# Patient Record
Sex: Male | Born: 1935 | Race: Black or African American | Hispanic: No | Smoking: Current every day smoker
Health system: Southern US, Community
[De-identification: ages and names within clinical notes are randomized; demographics above are authoritative.]

## PROBLEM LIST (undated history)

## (undated) DIAGNOSIS — I4891 Unspecified atrial fibrillation: Secondary | ICD-10-CM

## (undated) DIAGNOSIS — F329 Major depressive disorder, single episode, unspecified: Secondary | ICD-10-CM

## (undated) DIAGNOSIS — I2699 Other pulmonary embolism without acute cor pulmonale: Secondary | ICD-10-CM

## (undated) DIAGNOSIS — E119 Type 2 diabetes mellitus without complications: Secondary | ICD-10-CM

## (undated) DIAGNOSIS — F32A Depression, unspecified: Secondary | ICD-10-CM

## (undated) DIAGNOSIS — D72829 Elevated white blood cell count, unspecified: Secondary | ICD-10-CM

## (undated) DIAGNOSIS — D696 Thrombocytopenia, unspecified: Secondary | ICD-10-CM

## (undated) DIAGNOSIS — I10 Essential (primary) hypertension: Secondary | ICD-10-CM

## (undated) DIAGNOSIS — M6281 Muscle weakness (generalized): Secondary | ICD-10-CM

---

## 2006-09-01 ENCOUNTER — Ambulatory Visit: Payer: Self-pay | Admitting: Cardiology

## 2009-03-11 ENCOUNTER — Ambulatory Visit: Payer: Self-pay | Admitting: Cardiology

## 2009-04-11 ENCOUNTER — Encounter: Payer: Self-pay | Admitting: Cardiology

## 2010-05-03 NOTE — Letter (Signed)
Summary: Appointment -missed  [facility] [address]   [place], [place] [address]   Phone: [phone]  Fax: [phone]     April 11, 2009 MRN: [id number]      Brendan Walters [address] [place], [place]  [address]      Dear Mr. [name],  Our records indicate you missed your appointment on April 11, 2009                        with Dr.  Antoine Poche.   It is very important that we reach you to reschedule this appointment. We look forward to participating in your health care needs.   Please contact us at the number listed above at your earliest convenience to reschedule this appointment.   Sincerely,    Glass blower/designer

## 2012-09-04 ENCOUNTER — Emergency Department (HOSPITAL_COMMUNITY): Payer: Medicare Other

## 2012-09-04 ENCOUNTER — Emergency Department (HOSPITAL_COMMUNITY)
Admission: EM | Admit: 2012-09-04 | Discharge: 2012-09-04 | Disposition: A | Discharge status: Home / Self Care | Payer: Medicare Other | Attending: Emergency Medicine | Admitting: Emergency Medicine

## 2012-09-04 ENCOUNTER — Encounter (HOSPITAL_COMMUNITY): Payer: Self-pay | Admitting: Emergency Medicine

## 2012-09-04 DIAGNOSIS — M19012 Primary osteoarthritis, left shoulder: Secondary | ICD-10-CM

## 2012-09-04 DIAGNOSIS — I10 Essential (primary) hypertension: Secondary | ICD-10-CM | POA: Insufficient documentation

## 2012-09-04 DIAGNOSIS — M19019 Primary osteoarthritis, unspecified shoulder: Secondary | ICD-10-CM | POA: Insufficient documentation

## 2012-09-04 DIAGNOSIS — Z794 Long term (current) use of insulin: Secondary | ICD-10-CM | POA: Insufficient documentation

## 2012-09-04 DIAGNOSIS — Z7982 Long term (current) use of aspirin: Secondary | ICD-10-CM | POA: Insufficient documentation

## 2012-09-04 DIAGNOSIS — M25519 Pain in unspecified shoulder: Secondary | ICD-10-CM | POA: Insufficient documentation

## 2012-09-04 DIAGNOSIS — M25512 Pain in left shoulder: Secondary | ICD-10-CM

## 2012-09-04 DIAGNOSIS — R52 Pain, unspecified: Secondary | ICD-10-CM | POA: Insufficient documentation

## 2012-09-04 DIAGNOSIS — Z79899 Other long term (current) drug therapy: Secondary | ICD-10-CM | POA: Insufficient documentation

## 2012-09-04 DIAGNOSIS — E119 Type 2 diabetes mellitus without complications: Secondary | ICD-10-CM | POA: Insufficient documentation

## 2012-09-04 DIAGNOSIS — Z86711 Personal history of pulmonary embolism: Secondary | ICD-10-CM | POA: Insufficient documentation

## 2012-09-04 DIAGNOSIS — I4891 Unspecified atrial fibrillation: Secondary | ICD-10-CM | POA: Insufficient documentation

## 2012-09-04 HISTORY — DX: Other pulmonary embolism without acute cor pulmonale: I26.99

## 2012-09-04 HISTORY — DX: Type 2 diabetes mellitus without complications: E11.9

## 2012-09-04 HISTORY — DX: Essential (primary) hypertension: I10

## 2012-09-04 HISTORY — DX: Unspecified atrial fibrillation: I48.91

## 2012-09-04 LAB — POCT I-STAT TROPONIN I: Troponin i, poc: 0 ng/mL (ref 0.00–0.08)

## 2012-09-04 MED ORDER — ACETAMINOPHEN 325 MG PO TABS
650.0000 mg | ORAL_TABLET | Freq: Once | ORAL | Status: AC
  Administered 2012-09-04: 650 mg via ORAL
  Filled 2012-09-04: qty 2

## 2012-09-04 NOTE — ED Notes (Signed)
Pt c/o left side chest pain that radiates to left arm. Pt states pain began yesterday and describes pain as sharp and intermittent. Pt denies SOB, headache, dizziness, lightheadedness.

## 2012-09-04 NOTE — ED Provider Notes (Signed)
History  This chart was scribed for Brendan Melter, MD by [name], ED Scribe. This patient was seen in room APA16A/APA16A and the patient's care was started at 3:25 PM.  CSN: [id number]  Arrival date & time 09/04/12  1501   First MD Initiated Contact with Patient 09/04/12 1525      Chief Complaint  Patient presents with  . Chest Pain     Patient is a 79 y.o. male presenting with chest pain. The history is provided by the nursing home. No language interpreter was used.  Chest Pain Pain location:  L chest Pain quality: sharp   Pain radiates to:  L arm Pain radiates to the back: no   Onset quality:  Gradual Duration:  1 day Timing:  Intermittent Chronicity:  New Relieved by:  Nothing Worsened by:  Movement Ineffective treatments:  None tried Associated symptoms: no headache and no shortness of breath     HPI Comments: Brendan Walters is a 79 y.o. male who presents to the Emergency Department from [facility] complaining of intermittent left-sided CP described as sharp that radiates to the left arm that started yesterday. Pt states that the left arm pain is worse with movement. Dr. Selena Batten, PCP, was contacted and advised to have the pt evaluated due to h/o A. Fib. Oxy Sat was 78-80% prior to EMS which is low for the pt. He does not wear oxygen at baseline. Pt told EMS that the CP was improved with [place]. SNF denies emesis, diarrhea and cough. Pt at baseline has difficulty ambulating. They deny any recent illnesses. No other recent problems. Pt is currently on 81 mg ASA. Pt is at [facility] getting therapeutic exercises. He has an order for oxycodone 1 to 2 pills Q4 PRN, last dose was one pill at 6:30 AM for general pain.   Past Medical History  Diagnosis Date  . Hypertension   . A-fib   . Pulmonary embolism   . Diabetes mellitus without complication     History reviewed. No pertinent past surgical history.  History reviewed. No pertinent family history.  History  Substance Use  Topics  . Smoking status: Not on file  . Smokeless tobacco: Not on file  . Alcohol Use: Not on file      Review of Systems  Respiratory: Negative for shortness of breath.   Cardiovascular: Positive for chest pain.  Neurological: Negative for headaches.  All other systems reviewed and are negative.    Allergies  Review of patient's allergies indicates no known allergies.  Home Medications   Current Outpatient Rx  Name  Route  Sig  Dispense  Refill  . aspirin EC 81 MG tablet   Oral   Take 81 mg by mouth daily.         Marland Kitchen escitalopram (LEXAPRO) 10 MG tablet   Oral   Take 10 mg by mouth daily.         . feeding supplement (PRO-STAT SUGAR FREE 64) LIQD   Oral   Take 30 mLs by mouth 2 (two) times daily.         . insulin lispro (HUMALOG) 100 UNIT/ML injection   Subcutaneous   Inject 2-10 Units into the skin 3 (three) times daily before meals. SLIDING SCALE AS FOLLOWS: 200-250= 2 units  251-300= 4 units 301-350= 6 units 351-400= 8 units If greater than 400 units, give 10 units and notify MD         . metFORMIN (GLUCOPHAGE) 500 MG tablet  Oral   Take 500 mg by mouth daily.         . metoprolol tartrate (LOPRESSOR) 25 MG tablet   Oral   Take 12.5 mg by mouth 2 (two) times daily.         Marland Kitchen senna-docusate (SENOKOT-S) 8.6-50 MG per tablet   Oral   Take 2 tablets by mouth daily.           Triage Vitals: BP 109/61  Pulse 61  Temp(Src) 97.6 F (36.4 C) (Oral)  Resp 18  SpO2 100%  Physical Exam  Nursing note and vitals reviewed. Constitutional: He appears well-developed and well-nourished.  HENT:  Head: Normocephalic and atraumatic.  Right Ear: External ear normal.  Left Ear: External ear normal.  Eyes: Conjunctivae and EOM are normal. Pupils are equal, round, and reactive to light.  Right eye is opaque  Neck: Normal range of motion and phonation normal. Neck supple.  Cardiovascular: Normal rate, regular rhythm, normal heart sounds and intact  distal pulses.   Pulmonary/Chest: Effort normal and breath sounds normal. No respiratory distress. He has no wheezes. He has no rales. He exhibits no bony tenderness.  Good air movement  Abdominal: Soft. Normal appearance. There is no tenderness.  Musculoskeletal: Normal range of motion.  Crepitance in left shoulder, guards against movement secondary to pain. After passive range of motion throughout the full normal range of motion; the patient has improved movement, both passively and actively.  Neurological: He is alert. He has normal strength. No cranial nerve deficit or sensory deficit. He exhibits normal muscle tone. Coordination normal.  Skin: Skin is warm, dry and intact.  Psychiatric: He has a normal mood and affect. His behavior is normal.    ED Course  Procedures (including critical care time)  Medications  acetaminophen (TYLENOL) tablet 650 mg (650 mg Oral Given 09/04/12 1552)   Patient Vitals for the past 24 hrs:  BP Temp Temp src Pulse Resp SpO2  09/04/12 1645 111/58 mmHg - - 58 18 100 %  09/04/12 1506 109/61 mmHg 97.6 F (36.4 C) Oral 61 18 100 %      DIAGNOSTIC STUDIES: Oxygen Saturation is 100% on room, normal by my interpretation.    COORDINATION OF CARE: 3:39 PM-Discussed treatment plan which includes CXR, left shoulder xray, I-stat chem 8 and I-stat troponin with pt at bedside and pt agreed to plan.   Reevaluation discharge: Oxygen saturation 100%, on room air. No additional discomfort.   Labs Reviewed  POCT I-STAT TROPONIN I   Dg Chest 2 View  09/04/2012   *RADIOLOGY REPORT*  Clinical Data: Chest and left shoulder pain.  Arm pain.  CHEST - 2 VIEW  Comparison: 05/16/2011  Findings: Mild hyperinflation of the lungs.  Heart is normal size. Tortuosity of the thoracic aorta.  Healing left rib fractures involving several lower ribs.  No focal airspace opacities or effusions.  Degenerative changes in the shoulders.  IMPRESSION: Multiple healing left lower rib  fractures.  Mild hyperinflation.  No acute cardiopulmonary disease.   Original Report Authenticated By: Charlett Nose, M.D.   Dg Shoulder Left  09/04/2012   *RADIOLOGY REPORT*  Clinical Data: Left shoulder pain  LEFT SHOULDER - 2+ VIEW  Comparison:  None  Findings: Three views of the left shoulder submitted.  No acute fracture or subluxation.  Spurring of the humeral head.  Mild degenerative changes glenohumeral joint.  Degenerative changes are noted AC joint.  Spurring of the clavicle.  IMPRESSION: No acute fracture or subluxation.  Spurring of the humeral head. Mild degenerative changes glenohumeral joint.  Degenerative changes are noted AC joint.  Spurring of the clavicle.   Original Report Authenticated By: Natasha Mead, M.D.     1. Shoulder pain, acute, left   2. Degenerative joint disease of left shoulder       MDM  Reported chest and left arm pain with musculoskeletal findings for left shoulder pain; as the source of his discomfort. Doubt ACS, PE, or pneumonia. Doubt metabolic instability, serious bacterial infection or impending vascular collapse; the patient is stable for discharge.   Nursing Notes Reviewed/ Care Coordinated, and agree without changes. Applicable Imaging Reviewed. Radiologic imaging report reviewed and images by radiography  - viewed, by me. Interpretation of Laboratory Data incorporated into ED treatment   Plan: Home Medications- usual; Home Treatments- heat application; Recommended follow up- PCP, when necessary    I personally performed the services described in this documentation, which was scribed in my presence. The recorded information has been reviewed and is accurate.        Brendan Melter, MD 09/04/12 [phone]

## 2012-09-05 LAB — POCT I-STAT, CHEM 8
Calcium, Ion: 1.2 mmol/L (ref 1.13–1.30)
Chloride: 104 mEq/L (ref 96–112)
Creatinine, Ser: 1.2 mg/dL (ref 0.50–1.35)
Glucose, Bld: 163 mg/dL — ABNORMAL HIGH (ref 70–99)
HCT: 30 % — ABNORMAL LOW (ref 39.0–52.0)
Hemoglobin: 10.2 g/dL — ABNORMAL LOW (ref 13.0–17.0)

## 2012-10-18 ENCOUNTER — Emergency Department (HOSPITAL_COMMUNITY): Payer: Medicare Other

## 2012-10-18 ENCOUNTER — Emergency Department (HOSPITAL_COMMUNITY)
Admission: EM | Admit: 2012-10-18 | Discharge: 2012-10-18 | Disposition: A | Discharge status: Home / Self Care | Payer: Medicare Other | Attending: Emergency Medicine | Admitting: Emergency Medicine

## 2012-10-18 ENCOUNTER — Encounter (HOSPITAL_COMMUNITY): Payer: Self-pay | Admitting: *Deleted

## 2012-10-18 DIAGNOSIS — F329 Major depressive disorder, single episode, unspecified: Secondary | ICD-10-CM | POA: Insufficient documentation

## 2012-10-18 DIAGNOSIS — F911 Conduct disorder, childhood-onset type: Secondary | ICD-10-CM | POA: Insufficient documentation

## 2012-10-18 DIAGNOSIS — R4689 Other symptoms and signs involving appearance and behavior: Secondary | ICD-10-CM

## 2012-10-18 DIAGNOSIS — Z79899 Other long term (current) drug therapy: Secondary | ICD-10-CM | POA: Insufficient documentation

## 2012-10-18 DIAGNOSIS — Z7982 Long term (current) use of aspirin: Secondary | ICD-10-CM | POA: Insufficient documentation

## 2012-10-18 DIAGNOSIS — Z8679 Personal history of other diseases of the circulatory system: Secondary | ICD-10-CM | POA: Insufficient documentation

## 2012-10-18 DIAGNOSIS — Z86711 Personal history of pulmonary embolism: Secondary | ICD-10-CM | POA: Insufficient documentation

## 2012-10-18 DIAGNOSIS — D649 Anemia, unspecified: Secondary | ICD-10-CM

## 2012-10-18 DIAGNOSIS — Z794 Long term (current) use of insulin: Secondary | ICD-10-CM | POA: Insufficient documentation

## 2012-10-18 DIAGNOSIS — I10 Essential (primary) hypertension: Secondary | ICD-10-CM | POA: Insufficient documentation

## 2012-10-18 DIAGNOSIS — E119 Type 2 diabetes mellitus without complications: Secondary | ICD-10-CM | POA: Insufficient documentation

## 2012-10-18 DIAGNOSIS — Z862 Personal history of diseases of the blood and blood-forming organs and certain disorders involving the immune mechanism: Secondary | ICD-10-CM | POA: Insufficient documentation

## 2012-10-18 DIAGNOSIS — F3289 Other specified depressive episodes: Secondary | ICD-10-CM | POA: Insufficient documentation

## 2012-10-18 HISTORY — DX: Elevated white blood cell count, unspecified: D72.829

## 2012-10-18 HISTORY — DX: Muscle weakness (generalized): M62.81

## 2012-10-18 HISTORY — DX: Thrombocytopenia, unspecified: D69.6

## 2012-10-18 HISTORY — DX: Major depressive disorder, single episode, unspecified: F32.9

## 2012-10-18 HISTORY — DX: Depression, unspecified: F32.A

## 2012-10-18 LAB — CBC WITH DIFFERENTIAL/PLATELET
Basophils Absolute: 0.1 10*3/uL (ref 0.0–0.1)
Basophils Relative: 1 % (ref 0–1)
Eosinophils Absolute: 0.8 10*3/uL — ABNORMAL HIGH (ref 0.0–0.7)
HCT: 24.4 % — ABNORMAL LOW (ref 39.0–52.0)
Hemoglobin: 7.2 g/dL — ABNORMAL LOW (ref 13.0–17.0)
MCH: 19.4 pg — ABNORMAL LOW (ref 26.0–34.0)
MCHC: 29.5 g/dL — ABNORMAL LOW (ref 30.0–36.0)
Monocytes Absolute: 0.9 10*3/uL (ref 0.1–1.0)
Monocytes Relative: 9 % (ref 3–12)
Neutro Abs: 6.7 10*3/uL (ref 1.7–7.7)
RDW: 17.3 % — ABNORMAL HIGH (ref 11.5–15.5)

## 2012-10-18 LAB — BASIC METABOLIC PANEL
BUN: 18 mg/dL (ref 6–23)
Calcium: 9.1 mg/dL (ref 8.4–10.5)
Chloride: 101 mEq/L (ref 96–112)
Creatinine, Ser: 0.77 mg/dL (ref 0.50–1.35)
GFR calc Af Amer: 90 mL/min — ABNORMAL LOW (ref >90)
GFR calc non Af Amer: 78 mL/min — ABNORMAL LOW (ref >90)

## 2012-10-18 LAB — RAPID URINE DRUG SCREEN, HOSP PERFORMED
Barbiturates: NOT DETECTED
Cocaine: NOT DETECTED
Tetrahydrocannabinol: NOT DETECTED

## 2012-10-18 LAB — URINALYSIS, ROUTINE W REFLEX MICROSCOPIC
Bilirubin Urine: NEGATIVE
Hgb urine dipstick: NEGATIVE
Ketones, ur: NEGATIVE mg/dL
Nitrite: NEGATIVE
Protein, ur: NEGATIVE mg/dL
Urobilinogen, UA: 0.2 mg/dL (ref 0.0–1.0)

## 2012-10-18 LAB — ETHANOL: Alcohol, Ethyl (B): <11 mg/dL (ref 0–11)

## 2012-10-18 MED ORDER — QUETIAPINE FUMARATE 50 MG PO TABS: 50.0000 mg | ORAL_TABLET | Freq: Two times a day (BID) | ORAL | Status: DC
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
## 2012-10-18 NOTE — ED Notes (Signed)
[facility] called and asked for a psychiatric evaluation.
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
## 2012-10-18 NOTE — ED Notes (Signed)
Pt refused CT, states "there is nothing wrong with my head and I'm not doing that, I'm going to sleep now". EDP aware.

## 2012-10-18 NOTE — ED Notes (Signed)
Telepsych completed.  

## 2012-10-18 NOTE — ED Notes (Signed)
Tele-psych consult called at this time. Brendan Walters

## 2012-10-18 NOTE — ED Notes (Signed)
Patient discharged back to [facility]; report called to [facility].
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
## 2012-10-18 NOTE — ED Notes (Signed)
Patient awaiting transport back to [facility]' 

## 2012-10-18 NOTE — ED Notes (Signed)
Telepsych MD called and stated he is ready to call in.

## 2012-12-23 ENCOUNTER — Emergency Department (HOSPITAL_COMMUNITY)
Admission: EM | Admit: 2012-12-23 | Discharge: 2012-12-23 | Disposition: A | Discharge status: Home / Self Care | Payer: Medicare Other | Attending: Emergency Medicine | Admitting: Emergency Medicine

## 2012-12-23 ENCOUNTER — Encounter (HOSPITAL_COMMUNITY): Payer: Self-pay | Admitting: *Deleted

## 2012-12-23 DIAGNOSIS — IMO0002 Reserved for concepts with insufficient information to code with codable children: Secondary | ICD-10-CM | POA: Insufficient documentation

## 2012-12-23 DIAGNOSIS — D696 Thrombocytopenia, unspecified: Secondary | ICD-10-CM | POA: Insufficient documentation

## 2012-12-23 DIAGNOSIS — I10 Essential (primary) hypertension: Secondary | ICD-10-CM | POA: Insufficient documentation

## 2012-12-23 DIAGNOSIS — F17200 Nicotine dependence, unspecified, uncomplicated: Secondary | ICD-10-CM | POA: Insufficient documentation

## 2012-12-23 DIAGNOSIS — Z86711 Personal history of pulmonary embolism: Secondary | ICD-10-CM | POA: Insufficient documentation

## 2012-12-23 DIAGNOSIS — D72829 Elevated white blood cell count, unspecified: Secondary | ICD-10-CM | POA: Insufficient documentation

## 2012-12-23 DIAGNOSIS — E119 Type 2 diabetes mellitus without complications: Secondary | ICD-10-CM | POA: Insufficient documentation

## 2012-12-23 DIAGNOSIS — R451 Restlessness and agitation: Secondary | ICD-10-CM

## 2012-12-23 DIAGNOSIS — F0390 Unspecified dementia without behavioral disturbance: Secondary | ICD-10-CM | POA: Insufficient documentation

## 2012-12-23 DIAGNOSIS — Z79899 Other long term (current) drug therapy: Secondary | ICD-10-CM | POA: Insufficient documentation

## 2012-12-23 DIAGNOSIS — I4891 Unspecified atrial fibrillation: Secondary | ICD-10-CM | POA: Insufficient documentation

## 2012-12-23 DIAGNOSIS — Z794 Long term (current) use of insulin: Secondary | ICD-10-CM | POA: Insufficient documentation

## 2012-12-23 DIAGNOSIS — Z7982 Long term (current) use of aspirin: Secondary | ICD-10-CM | POA: Insufficient documentation

## 2012-12-23 HISTORY — DX: Major depressive disorder, single episode, unspecified: F32.9

## 2012-12-23 LAB — CBC WITH DIFFERENTIAL/PLATELET
Eosinophils Absolute: 0.2 10*3/uL (ref 0.0–0.7)
Eosinophils Relative: 3 % (ref 0–5)
Lymphocytes Relative: 12 % (ref 12–46)
MCH: 25.6 pg — ABNORMAL LOW (ref 26.0–34.0)
MCV: 82.1 fL (ref 78.0–100.0)
Monocytes Absolute: 0.6 10*3/uL (ref 0.1–1.0)
Monocytes Relative: 8 % (ref 3–12)
Neutro Abs: 5.8 10*3/uL (ref 1.7–7.7)
Neutrophils Relative %: 77 % (ref 43–77)
Platelets: 264 10*3/uL (ref 150–400)
RBC: 5.32 MIL/uL (ref 4.22–5.81)
WBC: 7.5 10*3/uL (ref 4.0–10.5)

## 2012-12-23 LAB — BASIC METABOLIC PANEL
BUN: 17 mg/dL (ref 6–23)
CO2: 28 mEq/L (ref 19–32)
Calcium: 9.8 mg/dL (ref 8.4–10.5)
GFR calc non Af Amer: 75 mL/min — ABNORMAL LOW (ref >90)
Potassium: 3.8 mEq/L (ref 3.5–5.1)
Sodium: 139 mEq/L (ref 135–145)

## 2012-12-23 LAB — URINALYSIS, ROUTINE W REFLEX MICROSCOPIC
Leukocytes, UA: NEGATIVE
Protein, ur: NEGATIVE mg/dL
Specific Gravity, Urine: >1.03 — ABNORMAL HIGH (ref 1.005–1.030)
Urobilinogen, UA: 0.2 mg/dL (ref 0.0–1.0)

## 2012-12-23 NOTE — ED Notes (Signed)
Report called to shannon at [facility], denies transportation for pt, ems called for transport
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
## 2012-12-23 NOTE — ED Notes (Signed)
MD at bedside. 
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
## 2016-02-21 ENCOUNTER — Ambulatory Visit (INDEPENDENT_AMBULATORY_CARE_PROVIDER_SITE_OTHER): Payer: Self-pay | Admitting: Orthopaedic Surgery

## 2016-07-20 ENCOUNTER — Emergency Department (HOSPITAL_COMMUNITY): Payer: Medicare Other

## 2016-07-20 ENCOUNTER — Observation Stay (HOSPITAL_COMMUNITY)
Admission: EM | Admit: 2016-07-20 | Discharge: 2016-07-23 | Disposition: A | Discharge status: Skilled Nursing Facility | Payer: Medicare Other | Attending: Family Medicine | Admitting: Family Medicine

## 2016-07-20 DIAGNOSIS — K3189 Other diseases of stomach and duodenum: Secondary | ICD-10-CM | POA: Diagnosis not present

## 2016-07-20 DIAGNOSIS — Z66 Do not resuscitate: Secondary | ICD-10-CM | POA: Diagnosis not present

## 2016-07-20 DIAGNOSIS — K2950 Unspecified chronic gastritis without bleeding: Secondary | ICD-10-CM | POA: Insufficient documentation

## 2016-07-20 DIAGNOSIS — Z993 Dependence on wheelchair: Secondary | ICD-10-CM | POA: Insufficient documentation

## 2016-07-20 DIAGNOSIS — D500 Iron deficiency anemia secondary to blood loss (chronic): Secondary | ICD-10-CM

## 2016-07-20 DIAGNOSIS — I4891 Unspecified atrial fibrillation: Secondary | ICD-10-CM | POA: Diagnosis not present

## 2016-07-20 DIAGNOSIS — D62 Acute posthemorrhagic anemia: Secondary | ICD-10-CM | POA: Insufficient documentation

## 2016-07-20 DIAGNOSIS — F0280 Dementia in other diseases classified elsewhere without behavioral disturbance: Secondary | ICD-10-CM

## 2016-07-20 DIAGNOSIS — Z79899 Other long term (current) drug therapy: Secondary | ICD-10-CM | POA: Insufficient documentation

## 2016-07-20 DIAGNOSIS — F17210 Nicotine dependence, cigarettes, uncomplicated: Secondary | ICD-10-CM | POA: Diagnosis not present

## 2016-07-20 DIAGNOSIS — D696 Thrombocytopenia, unspecified: Secondary | ICD-10-CM | POA: Insufficient documentation

## 2016-07-20 DIAGNOSIS — Z8551 Personal history of malignant neoplasm of bladder: Secondary | ICD-10-CM | POA: Diagnosis not present

## 2016-07-20 DIAGNOSIS — H5461 Unqualified visual loss, right eye, normal vision left eye: Secondary | ICD-10-CM | POA: Insufficient documentation

## 2016-07-20 DIAGNOSIS — G309 Alzheimer's disease, unspecified: Secondary | ICD-10-CM | POA: Insufficient documentation

## 2016-07-20 DIAGNOSIS — I10 Essential (primary) hypertension: Secondary | ICD-10-CM

## 2016-07-20 DIAGNOSIS — Z7982 Long term (current) use of aspirin: Secondary | ICD-10-CM | POA: Insufficient documentation

## 2016-07-20 DIAGNOSIS — Z7984 Long term (current) use of oral hypoglycemic drugs: Secondary | ICD-10-CM | POA: Diagnosis not present

## 2016-07-20 DIAGNOSIS — E119 Type 2 diabetes mellitus without complications: Secondary | ICD-10-CM | POA: Diagnosis not present

## 2016-07-20 DIAGNOSIS — K921 Melena: Secondary | ICD-10-CM | POA: Diagnosis not present

## 2016-07-20 DIAGNOSIS — K449 Diaphragmatic hernia without obstruction or gangrene: Secondary | ICD-10-CM | POA: Diagnosis not present

## 2016-07-20 DIAGNOSIS — Z86711 Personal history of pulmonary embolism: Secondary | ICD-10-CM | POA: Insufficient documentation

## 2016-07-20 DIAGNOSIS — K31819 Angiodysplasia of stomach and duodenum without bleeding: Secondary | ICD-10-CM | POA: Diagnosis not present

## 2016-07-20 DIAGNOSIS — D649 Anemia, unspecified: Secondary | ICD-10-CM | POA: Diagnosis present

## 2016-07-20 LAB — CBC WITH DIFFERENTIAL/PLATELET
Basophils Absolute: 0 10*3/uL (ref 0.0–0.1)
Basophils Relative: 0 %
EOS ABS: 0.2 10*3/uL (ref 0.0–0.7)
EOS PCT: 2 %
HCT: 18.9 % — ABNORMAL LOW (ref 39.0–52.0)
Hemoglobin: 5.3 g/dL — CL (ref 13.0–17.0)
Lymphocytes Relative: 24 %
Lymphs Abs: 1.8 10*3/uL (ref 0.7–4.0)
MCH: 19 pg — ABNORMAL LOW (ref 26.0–34.0)
MCHC: 28 g/dL — ABNORMAL LOW (ref 30.0–36.0)
MCV: 67.7 fL — ABNORMAL LOW (ref 78.0–100.0)
Monocytes Absolute: 0.8 10*3/uL (ref 0.1–1.0)
Monocytes Relative: 10 %
NEUTROS PCT: 64 %
Neutro Abs: 5 10*3/uL (ref 1.7–7.7)
Platelets: 384 10*3/uL (ref 150–400)
RBC: 2.79 MIL/uL — AB (ref 4.22–5.81)
RDW: 17.6 % — AB (ref 11.5–15.5)
WBC: 7.8 10*3/uL (ref 4.0–10.5)

## 2016-07-20 LAB — ABO/RH: ABO/RH(D): O POS

## 2016-07-20 LAB — COMPREHENSIVE METABOLIC PANEL
ALBUMIN: 3.7 g/dL (ref 3.5–5.0)
ALK PHOS: 76 U/L (ref 38–126)
ALT: 10 U/L — ABNORMAL LOW (ref 17–63)
AST: 14 U/L — ABNORMAL LOW (ref 15–41)
Anion gap: 8 (ref 5–15)
BUN: 17 mg/dL (ref 6–20)
CO2: 24 mmol/L (ref 22–32)
Calcium: 8.8 mg/dL — ABNORMAL LOW (ref 8.9–10.3)
Chloride: 106 mmol/L (ref 101–111)
Creatinine, Ser: 0.9 mg/dL (ref 0.61–1.24)
GFR calc non Af Amer: >60 mL/min (ref >60)
Glucose, Bld: 120 mg/dL — ABNORMAL HIGH (ref 65–99)
POTASSIUM: 4 mmol/L (ref 3.5–5.1)
SODIUM: 138 mmol/L (ref 135–145)
TOTAL PROTEIN: 6.7 g/dL (ref 6.5–8.1)
Total Bilirubin: 0.4 mg/dL (ref 0.3–1.2)

## 2016-07-20 LAB — GLUCOSE, CAPILLARY: GLUCOSE-CAPILLARY: 122 mg/dL — AB (ref 65–99)

## 2016-07-20 LAB — PREPARE RBC (CROSSMATCH)

## 2016-07-20 LAB — PROTIME-INR
INR: 1.05
Prothrombin Time: 13.8 seconds (ref 11.4–15.2)

## 2016-07-20 MED ORDER — ATORVASTATIN CALCIUM 10 MG PO TABS
10.0000 mg | ORAL_TABLET | Freq: Every day | ORAL | Status: DC
  Administered 2016-07-22 – 2016-07-23 (×2): 10 mg via ORAL
  Filled 2016-07-20 (×3): qty 1

## 2016-07-20 MED ORDER — ONDANSETRON HCL 4 MG/2ML IJ SOLN: 4.0000 mg | Freq: Four times a day (QID) | INTRAMUSCULAR | Status: DC | PRN

## 2016-07-20 MED ORDER — ONDANSETRON HCL 4 MG PO TABS: 4.0000 mg | ORAL_TABLET | Freq: Four times a day (QID) | ORAL | Status: DC | PRN

## 2016-07-20 MED ORDER — VITAMIN D (ERGOCALCIFEROL) 1.25 MG (50000 UNIT) PO CAPS
50000.0000 [IU] | ORAL_CAPSULE | ORAL | Status: DC
  Filled 2016-07-20: qty 1

## 2016-07-20 MED ORDER — IPRATROPIUM-ALBUTEROL 0.5-2.5 (3) MG/3ML IN SOLN: 3.0000 mL | Freq: Every day | RESPIRATORY_TRACT | Status: DC | PRN

## 2016-07-20 MED ORDER — ACETAMINOPHEN 650 MG RE SUPP: 650.0000 mg | Freq: Four times a day (QID) | RECTAL | Status: DC | PRN

## 2016-07-20 MED ORDER — ACETAMINOPHEN 325 MG PO TABS: 650.0000 mg | ORAL_TABLET | Freq: Four times a day (QID) | ORAL | Status: DC | PRN

## 2016-07-20 MED ORDER — PANTOPRAZOLE SODIUM 40 MG IV SOLR
40.0000 mg | INTRAVENOUS | Status: DC
Start: 2016-07-20 — End: 2016-07-22
  Administered 2016-07-21 (×2): 40 mg via INTRAVENOUS
  Filled 2016-07-20 (×2): qty 40

## 2016-07-20 MED ORDER — SODIUM CHLORIDE 0.9 % IV SOLN
10.0000 mL/h | Freq: Once | INTRAVENOUS | Status: AC
  Administered 2016-07-21: 10 mL/h via INTRAVENOUS

## 2016-07-20 MED ORDER — INSULIN ASPART 100 UNIT/ML ~~LOC~~ SOLN
0.0000 [IU] | Freq: Three times a day (TID) | SUBCUTANEOUS | Status: DC
  Administered 2016-07-21 – 2016-07-23 (×4): 1 [IU] via SUBCUTANEOUS

## 2016-07-20 NOTE — ED Provider Notes (Signed)
[facility]-EM[facility]Y DEPT Provider Note   CSN: [id number] Arrival date & time: 07/20/16  1756     History   Chief Complaint Chief Complaint  Patient presents with  . Anemia    HPI Brendan Walters is a 83 y.o. male.  Patient sent in from nursing facility patient is a DO NOT RESUSCITATE. Patient had a low hemoglobin measured at 5.9. Patient appeared to be in no distress. Patient has significant dementia. Family did want blood transfusion done.      Past Medical History:  Diagnosis Date  . A-fib   . Depression   . Depressive disorder   . Diabetes mellitus without complication   . Hypertension   . Leukocytosis   . Muscle weakness   . Pulmonary embolism   . Thrombocytopenia     There are no active problems to display for this patient.   No past surgical history on file.     Home Medications    Prior to Admission medications   Medication Sig Start Date End Date Taking? Authorizing Provider  ASCORBIC ACID PO Take 1 tablet by mouth 2 (two) times daily.    Historical Provider, MD  aspirin EC 81 MG tablet Take 81 mg by mouth daily.    Historical Provider, MD  escitalopram (LEXAPRO) 10 MG tablet Take 10 mg by mouth daily.    Historical Provider, MD  feeding supplement (PRO-STAT SUGAR FREE 64) LIQD Take 30 mLs by mouth 2 (two) times daily.    Historical Provider, MD  ferrous sulfate 325 (65 FE) MG tablet Take 325 mg by mouth 2 (two) times daily.    Historical Provider, MD  insulin lispro (HUMALOG) 100 UNIT/ML injection Inject 2-10 Units into the skin 3 (three) times daily before meals. SLIDING SCALE AS FOLLOWS: 200-250= 2 units  251-300= 4 units 301-350= 6 units 351-400= 8 units If greater than 400 units, give 10 units and notify MD    Historical Provider, MD  metFORMIN (GLUCOPHAGE) 500 MG tablet Take 500 mg by mouth daily.    Historical Provider, MD  metoprolol tartrate (LOPRESSOR) 25 MG tablet Take 12.5 mg by mouth 2 (two) times daily.    Historical Provider, MD    omeprazole (PRILOSEC) 20 MG capsule Take 20 mg by mouth daily.    Historical Provider, MD  oxyCODONE (OXY IR/ROXICODONE) 5 MG immediate release tablet Take 5 mg by mouth every 4 (four) hours as needed for pain.    Historical Provider, MD  QUEtiapine (SEROQUEL) 50 MG tablet Take 1 tablet (50 mg total) by mouth 2 (two) times daily. 10/18/12   Zadie Rhine, MD  senna-docusate (SENOKOT-S) 8.6-50 MG per tablet Take 2 tablets by mouth daily.    Historical Provider, MD    Family History No family history on file.  Social History Social History  Substance Use Topics  . Smoking status: Current Every Day Smoker    Types: Cigarettes  . Smokeless tobacco: Not on file  . Alcohol use No     Allergies   Patient has no known allergies.   Review of Systems Review of Systems  Unable to perform ROS: Dementia     Physical Exam Updated Vital Signs BP (!) 105/55 (BP Location: Left Arm)   Pulse 79   Resp 17   Ht  (1.854 m)   Wt 83 kg   SpO2 100%   BMI 24.14 kg/m   Physical Exam  Constitutional: He appears well-developed and well-nourished. No distress.  Eyes:  Right eye most likely  without vision due to sniffing cloudiness of the cornea.  Neck: Neck supple.  Pulmonary/Chest: Effort normal and breath sounds normal.  Abdominal: Soft. Bowel sounds are normal. There is no tenderness.  Genitourinary: Rectal exam shows guaiac positive stool.  Genitourinary Comments: Rectal exam with lots of soft stool black in color HEm positive  Musculoskeletal: Normal range of motion. He exhibits no edema.  Neurological: He is alert. No cranial nerve deficit or sensory deficit. He exhibits normal muscle tone. Coordination normal.  Skin: Skin is warm.  Nursing note and vitals reviewed.    ED Treatments / Results  Labs (all labs ordered are listed, but only abnormal results are displayed) Labs Reviewed  CBC WITH DIFFERENTIAL/PLATELET - Abnormal; Notable for the following:       Result Value    RBC 2.79 (*)    Hemoglobin 5.3 (*)    HCT 18.9 (*)    MCV 67.7 (*)    MCH 19.0 (*)    MCHC 28.0 (*)    RDW 17.6 (*)    All other components within normal limits  COMPREHENSIVE METABOLIC PANEL - Abnormal; Notable for the following:    Glucose, Bld 120 (*)    Calcium 8.8 (*)    AST 14 (*)    ALT 10 (*)    All other components within normal limits  PROTIME-INR  TYPE AND SCREEN  PREPARE RBC (CROSSMATCH)  ABO/RH    EKG  EKG Interpretation None       Radiology No results found.  Procedures Procedures (including critical care time)  CRITICAL CARE Performed by: Kerrie Latour Total critical care time: 30 minutes Critical care time was exclusive of separately billable procedures and treating other patients. Critical care was necessary to treat or prevent imminent or life-threatening deterioration. Critical care was time spent personally by me on the following activities: development of treatment plan with patient and/or surrogate as well as nursing, discussions with consultants, evaluation of patient's response to treatment, examination of patient, obtaining history from patient or surrogate, ordering and performing treatments and interventions, ordering and review of laboratory studies, ordering and review of radiographic studies, pulse oximetry and re-evaluation of patient's condition.   Medications Ordered in ED Medications  0.9 %  sodium chloride infusion (not administered)     Initial Impression / Assessment and Plan / ED Course  I have reviewed the triage vital signs and the nursing notes.  Pertinent labs & imaging results that were available during my care of the patient were reviewed by me and considered in my medical decision making (see chart for details).   significant anemia. Most likely due to GI blood loss based on rectal exam with black stool is heme positive. Have ordered 2 units of packed red blood cells to be transfused. Hospitalist team will admit  him. Patient is a DO NOT RESUSCITATE patient's family did want him to have a blood transfusion.   Final Clinical Impressions(s) / ED Diagnoses   Final diagnoses:  Anemia, unspecified type    New Prescriptions New Prescriptions   No medications on file     Vanetta Mulders, MD 07/20/16 2037

## 2016-07-20 NOTE — H&P (Signed)
History and Physical  Brendan Walters WGN:[id number] DOB: [birth year] DOA: 07/20/2016   PCP: Pearson Grippe, MD   Patient coming from: SNF Chief Complaint: abnormal blood work  HPI:  Brendan Walters is a 83 y.o. male with medical history of diabetes mellitus, hypertension, dementia, atrial fibrillation and depression presents from SNF with abnormal labs showing low Hgb of 5.9 done on routine blood work.  The patient is a poor historian due to history of dementia.  He knows he is in the hospital, but does not know why he is in the hospital or how he got here.  Patient denies fevers, chills, headache, chest pain, dyspnea, nausea, vomiting, diarrhea, abdominal pain, dysuria, hematuria, hematochezia, and melena.  He denies any dizziness or syncope.  It is unclear why CBC was obtained at the SNF.  In the ED, he was afebrile and hemodynamically stable with 100% saturation on RA.  BMP and LFTs were unermarkable.  Hgb was 5.3.  EDP performed rectal exam which showed melanotic stools that were heme positive. INR was 1.05.  Two units PRBCs were ordered. CXR showed bibasilar atelectasis  Assessment/Plan: Chronic Blood Loss Anemia -presented with Hgb 5.3 -12/23/12 Hgb 13.6 -iron/tibc/ferritin -check reticulocytes -2 units PRBCs ordered -check PTT -IV PPI -Clear liquid diet -GI consult requested -holding ASA  Diabetes Mellitus type 2 -holding metformin -novolog sliding scale -HbA1C  Essential HTN -holding metoprolol due to soft BP  Dementia -continue seroquel -continue lexapro  Atrial Fibrillation history -EKG -rate controlled        Past Medical History:  Diagnosis Date  . A-fib   . Depression   . Depressive disorder   . Diabetes mellitus without complication   . Hypertension   . Leukocytosis   . Muscle weakness   . Pulmonary embolism   . Thrombocytopenia    No past surgical history on file. Social History:  reports that he has been smoking Cigarettes.  He does not  have any smokeless tobacco history on file. He reports that he does not drink alcohol. His drug history is not on file.   Family History--unobtainable secondary to dementia  No Known Allergies   Prior to Admission medications   Medication Sig Start Date End Date Taking? Authorizing Provider  acetaminophen (TYLENOL) 650 MG CR tablet Take 650 mg by mouth every 8 (eight) hours as needed for pain.   Yes Historical Provider, MD  aspirin EC 81 MG tablet Take 81 mg by mouth daily.   Yes Historical Provider, MD  atorvastatin (LIPITOR) 10 MG tablet Take 10 mg by mouth daily.   Yes Historical Provider, MD  ipratropium-albuterol (DUONEB) 0.5-2.5 (3) MG/3ML SOLN Take 3 mLs by nebulization daily as needed (for shortness of breath).   Yes Historical Provider, MD  metFORMIN (GLUCOPHAGE) 1000 MG tablet Take 1,000 mg by mouth 2 (two) times daily with a meal.    Yes Historical Provider, MD  Vitamin D, Ergocalciferol, (DRISDOL) 50000 units CAPS capsule Take 50,000 Units by mouth every 7 (seven) days.   Yes Historical Provider, MD    Review of Systems:  Constitutional:  No weight loss, night sweats, Fevers, chills, fatigue.  Head&Eyes: No headache.  No vision loss.  No eye pain or scotoma ENT:  No Difficulty swallowing,Tooth/dental problems,Sore throat,   Cardio-vascular:  No chest pain, Orthopnea, PND, swelling in lower extremities,  dizziness, GI:  No  abdominal pain, nausea, vomiting, diarrhea, loss of appetite, hematochezia, melena, Resp:  No shortness of breath with exertion or at rest.  No cough. No coughing up of blood  Skin:  no rash or lesions.  GU:  no dysuria, change in color of urine, no urgency or frequency. No flank pain.  Musculoskeletal:  No joint pain or swelling. No decreased range of motion. No back pain.  Psych:  No change in mood or affect.  Neurologic: No headache, no dysesthesia,no vision loss. No syncope  Physical Exam: Vitals:   07/20/16 1800 07/20/16 1803 07/20/16  2000  BP: (!) 133/56  (!) 105/55  Pulse: 76  79  Resp: (!) 21  17  SpO2: 98%  100%  Weight:  83 kg (183 lb)   Height:   (1.854 m)    General:  A&O x 2, NAD, nontoxic, pleasant/cooperative Head/Eye: No conjunctival hemorrhage, no icterus, [place]/AT, No nystagmus ENT:  No icterus,  No thrush, good dentition, no pharyngeal exudate Neck:  No masses, no lymphadenpathy, no bruits CV:  RRR, no rub, no gallop, no S3 Lung:  CTAB, good air movement, no wheeze, no rhonchi Abdomen: soft/NT, +BS, nondistended, no peritoneal signs Ext: No cyanosis, No rashes, No petechiae, No lymphangitis, No edema Neuro: CNII-XII intact, strength 4/5 in bilateral upper and lower extremities  Labs on Admission:  Basic Metabolic Panel:  Recent Labs Lab 07/20/16 1800  NA 138  K 4.0  CL 106  CO2 24  GLUCOSE 120*  BUN 17  CREATININE 0.90  CALCIUM 8.8*   Liver Function Tests:  Recent Labs Lab 07/20/16 1800  AST 14*  ALT 10*  ALKPHOS 76  BILITOT 0.4  PROT 6.7  ALBUMIN 3.7   No results for input(s): LIPASE, AMYLASE in the last 168 hours. No results for input(s): AMMONIA in the last 168 hours. CBC:  Recent Labs Lab 07/20/16 1800  WBC 7.8  NEUTROABS 5.0  HGB 5.3*  HCT 18.9*  MCV 67.7*  PLT 384   Coagulation Profile:  Recent Labs Lab 07/20/16 1800  INR 1.05   Cardiac Enzymes: No results for input(s): CKTOTAL, CKMB, CKMBINDEX, TROPONINI in the last 168 hours. BNP: Invalid input(s): POCBNP CBG: No results for input(s): GLUCAP in the last 168 hours. Urine analysis:    Component Value Date/Time   COLORURINE YELLOW 12/23/2012 1433   APPEARANCEUR CLEAR 12/23/2012 1433   LABSPEC >1.030 (H) 12/23/2012 1433   PHURINE 5.5 12/23/2012 1433   GLUCOSEU 250 (A) 12/23/2012 1433   HGBUR NEGATIVE 12/23/2012 1433   BILIRUBINUR NEGATIVE 12/23/2012 1433   KETONESUR NEGATIVE 12/23/2012 1433   PROTEINUR NEGATIVE 12/23/2012 1433   UROBILINOGEN 0.2 12/23/2012 1433   NITRITE NEGATIVE 12/23/2012  1433   LEUKOCYTESUR NEGATIVE 12/23/2012 1433   Sepsis Labs: (procalcitonin:4,lacticidven:4) )No results found for this or any previous visit (from the past 240 hour(s)).   Radiological Exams on Admission: Dg Chest Port 1 View  Result Date: 07/20/2016 CLINICAL DATA:  Atrial fibrillation EXAM: PORTABLE CHEST 1 VIEW COMPARISON:  09/04/2012 FINDINGS: The lungs are hyperinflated. Mild bibasilar atelectasis or scar, unchanged. No acute infiltrate or effusion. Stable cardiomediastinal silhouette. No pneumothorax. Old left lower rib fractures. IMPRESSION: Scarring or atelectasis at both lung bases. Hyperinflation. No acute infiltrate or edema. Electronically Signed   By: Jasmine Pang M.D.   On: 07/20/2016 20:31        Time spent:60 minutes Code Status:   DNR Family Communication:  No Family at bedside Disposition Plan: expect 1-2 day hospitalization Consults called: GI DVT Prophylaxis:  SCDs  Norvella Loscalzo, DO  [facility] Pager [phone]  If 7PM-7AM, please contact night-coverage [email or web address]  Password [email or web address] 07/20/2016, 8:52 PM

## 2016-07-20 NOTE — ED Triage Notes (Signed)
Patient from [facility]. Per NP at facility, evaluating patient for atrial fibrillation. Patient has refused medication treatment for afib for a while. Today, CBC was done as part of evaluation, and patient found to be anemic with HGB of 5.9. Daughter is POA and wants treatment for anemia. Patient is alert and oriented currently. Does have h/o dementia.

## 2016-07-21 ENCOUNTER — Encounter (HOSPITAL_COMMUNITY): Payer: Self-pay | Admitting: *Deleted

## 2016-07-21 DIAGNOSIS — I10 Essential (primary) hypertension: Secondary | ICD-10-CM | POA: Diagnosis not present

## 2016-07-21 DIAGNOSIS — F0280 Dementia in other diseases classified elsewhere without behavioral disturbance: Secondary | ICD-10-CM

## 2016-07-21 DIAGNOSIS — D500 Iron deficiency anemia secondary to blood loss (chronic): Secondary | ICD-10-CM

## 2016-07-21 DIAGNOSIS — G309 Alzheimer's disease, unspecified: Secondary | ICD-10-CM | POA: Diagnosis not present

## 2016-07-21 DIAGNOSIS — D649 Anemia, unspecified: Secondary | ICD-10-CM

## 2016-07-21 DIAGNOSIS — K921 Melena: Secondary | ICD-10-CM

## 2016-07-21 DIAGNOSIS — K922 Gastrointestinal hemorrhage, unspecified: Secondary | ICD-10-CM | POA: Diagnosis not present

## 2016-07-21 LAB — RETICULOCYTES
RBC.: 3.16 MIL/uL — AB (ref 4.22–5.81)
RETIC CT PCT: 1.3 % (ref 0.4–3.1)
Retic Count, Absolute: 41.1 10*3/uL (ref 19.0–186.0)

## 2016-07-21 LAB — URINALYSIS, COMPLETE (UACMP) WITH MICROSCOPIC
Bilirubin Urine: NEGATIVE
GLUCOSE, UA: NEGATIVE mg/dL
Hgb urine dipstick: NEGATIVE
KETONES UR: NEGATIVE mg/dL
Nitrite: POSITIVE — AB
PH: 5 (ref 5.0–8.0)
Protein, ur: NEGATIVE mg/dL
SPECIFIC GRAVITY, URINE: 1.016 (ref 1.005–1.030)

## 2016-07-21 LAB — GLUCOSE, CAPILLARY
GLUCOSE-CAPILLARY: 113 mg/dL — AB (ref 65–99)
Glucose-Capillary: 147 mg/dL — ABNORMAL HIGH (ref 65–99)
Glucose-Capillary: 112 mg/dL — ABNORMAL HIGH (ref 65–99)
Glucose-Capillary: 133 mg/dL — ABNORMAL HIGH (ref 65–99)

## 2016-07-21 LAB — CBC
HCT: 21.7 % — ABNORMAL LOW (ref 39.0–52.0)
HEMATOCRIT: 29.6 % — AB (ref 39.0–52.0)
Hemoglobin: 6.4 g/dL — CL (ref 13.0–17.0)
Hemoglobin: 9.1 g/dL — ABNORMAL LOW (ref 13.0–17.0)
MCH: 20.3 pg — ABNORMAL LOW (ref 26.0–34.0)
MCH: 22.3 pg — ABNORMAL LOW (ref 26.0–34.0)
MCHC: 29.5 g/dL — ABNORMAL LOW (ref 30.0–36.0)
MCHC: 30.7 g/dL (ref 30.0–36.0)
MCV: 68.7 fL — ABNORMAL LOW (ref 78.0–100.0)
MCV: 72.5 fL — AB (ref 78.0–100.0)
PLATELETS: 339 10*3/uL (ref 150–400)
Platelets: 316 10*3/uL (ref 150–400)
RBC: 3.16 MIL/uL — AB (ref 4.22–5.81)
RBC: 4.08 MIL/uL — ABNORMAL LOW (ref 4.22–5.81)
RDW: 17.9 % — AB (ref 11.5–15.5)
RDW: 19.6 % — AB (ref 11.5–15.5)
WBC: 7.4 10*3/uL (ref 4.0–10.5)
WBC: 8.6 10*3/uL (ref 4.0–10.5)

## 2016-07-21 LAB — FERRITIN: FERRITIN: 3 ng/mL — AB (ref 24–336)

## 2016-07-21 LAB — APTT: APTT: 35 s (ref 24–36)

## 2016-07-21 LAB — IRON AND TIBC
Iron: 7 ug/dL — ABNORMAL LOW (ref 45–182)
Saturation Ratios: 2 % — ABNORMAL LOW (ref 17.9–39.5)
TIBC: 305 ug/dL (ref 250–450)
UIBC: 298 ug/dL

## 2016-07-21 LAB — MRSA PCR SCREENING: MRSA by PCR: NEGATIVE

## 2016-07-21 MED ORDER — SODIUM CHLORIDE 0.9 % IV SOLN
Freq: Once | INTRAVENOUS | Status: AC
  Administered 2016-07-21: 14:00:00 via INTRAVENOUS

## 2016-07-21 NOTE — Consult Note (Signed)
Referring Provider: Filbert Schilder, MD Primary Care Physician:  Pearson Grippe, MD Primary Gastroenterologist:  Dr. Karilyn Cota  Reason for Consultation:   Melena and anemia.  HPI:   History is obtained from the patient, his chart as well as nursing staff at [facility].  Patient is 83 year old African-American male who has been resident of [facility] for 5 years. He has multiple medical problems including diabetes mellitus and depression. He has been wheelchair-bound. Patient had been refusing his medications over the last few days. He had blood work done 2 days ago and his hemoglobin was 5.9 g. According to Mrs. Ruffin Pyo RN of [facility], his hemoglobin was 10 g on 06/20/2016. His history of melena or rectal bleeding or hematemesis. He has not been complaining of pain nausea or vomiting. There is also no history of hematuria(which he has history of any was diagnosed with bladder cancer). Patient has received 2 units of PRBCs and his hemoglobin is up to 6.4 g. He is therefore receiving third unit PRBCs. He has been on low-dose aspirin but does not take other NSAIDs or anticoagulant. He states he has very good appetite and denies weight loss.  He has never drank alcohol but he smokes cigarettes. He smokes 5 to cigarettes per day. Patient states he has 9 children. His daughter Ms. Lady Deutscher has a fraternity. She lives in [place] of [place].     Past Medical History:  Diagnosis Date  . A-fib (HCC)   . Depression   . Depressive disorder   . Diabetes mellitus without complication (HCC)   . Hypertension   . Leukocytosis   . Muscle weakness   . Pulmonary embolism (HCC)   . Thrombocytopenia (HCC)Platelet count normal on this admission          History of bladder cancer.       Patient is blind in right eye.  No past surgical history on file.  Prior to Admission medications   Medication Sig Start Date End Date Taking? Authorizing Provider  acetaminophen (TYLENOL) 650 MG CR tablet Take 650 mg by  mouth every 8 (eight) hours as needed for pain.   Yes Historical Provider, MD  aspirin EC 81 MG tablet Take 81 mg by mouth daily.   Yes Historical Provider, MD  atorvastatin (LIPITOR) 10 MG tablet Take 10 mg by mouth daily.   Yes Historical Provider, MD  ipratropium-albuterol (DUONEB) 0.5-2.5 (3) MG/3ML SOLN Take 3 mLs by nebulization daily as needed (for shortness of breath).   Yes Historical Provider, MD  metFORMIN (GLUCOPHAGE) 1000 MG tablet Take 1,000 mg by mouth 2 (two) times daily with a meal.    Yes Historical Provider, MD  Vitamin D, Ergocalciferol, (DRISDOL) 50000 units CAPS capsule Take 50,000 Units by mouth every 7 (seven) days.   Yes Historical Provider, MD    Current Facility-Administered Medications  Medication Dose Route Frequency Provider Last Rate Last Dose  . 0.9 %  sodium chloride infusion  10 mL/hr Intravenous Once Vanetta Mulders, MD 10 mL/hr at 07/21/16 0035 10 mL/hr at 07/21/16 0035  . 0.9 %  sodium chloride infusion   Intravenous Once Filbert Schilder, MD      . acetaminophen (TYLENOL) tablet 650 mg  650 mg Oral Q6H PRN Catarina Hartshorn, MD       Or  . acetaminophen (TYLENOL) suppository 650 mg  650 mg Rectal Q6H PRN Catarina Hartshorn, MD      . atorvastatin (LIPITOR) tablet 10 mg  10 mg Oral Daily Catarina Hartshorn,  MD      . insulin aspart (novoLOG) injection 0-9 Units  0-9 Units Subcutaneous TID WC Catarina Hartshorn, MD   1 Units at 07/21/16 1325  . ipratropium-albuterol (DUONEB) 0.5-2.5 (3) MG/3ML nebulizer solution 3 mL  3 mL Nebulization Daily PRN Catarina Hartshorn, MD      . ondansetron (ZOFRAN) tablet 4 mg  4 mg Oral Q6H PRN Catarina Hartshorn, MD       Or  . ondansetron (ZOFRAN) injection 4 mg  4 mg Intravenous Q6H PRN Catarina Hartshorn, MD      . pantoprazole (PROTONIX) injection 40 mg  40 mg Intravenous Q24H Catarina Hartshorn, MD   40 mg at 07/21/16 0102  . [START ON 07/25/2016] Vitamin D (Ergocalciferol) (DRISDOL) capsule 50,000 Units  50,000 Units Oral Q7 days Catarina Hartshorn, MD        Allergies as of 07/20/2016  . (No  Known Allergies)    No family history on file.  Social History   Social History  . Marital status: Widowed    Spouse name: N/A  . Number of children: N/A  . Years of education: N/A   Occupational History  . Not on file.   Social History Main Topics  . Smoking status: Current Every Day Smoker    Types: Cigarettes  . Smokeless tobacco: Not on file  . Alcohol use No  . Drug use: Unknown  . Sexual activity: Not on file   Other Topics Concern  . Not on file   Social History Narrative  . No narrative on file    Review of Systems: See HPI, otherwise normal ROS  Physical Exam: Temp:  [97.6 F (36.4 C)-98.7 F (37.1 C)] 97.9 F (36.6 C) (04/21 1117) Pulse Rate:  [60-90] 62 (04/21 1117) Resp:  [10-21] 16 (04/21 1117) BP: (92-133)/(34-76) 106/41 (04/21 1117) SpO2:  [97 %-100 %] 100 % (04/21 1117) Weight:  [165 lb 9.1 oz (75.1 kg)-183 lb (83 kg)] 165 lb 9.1 oz (75.1 kg) (04/20 2304) Last BM Date: 07/19/16  Patient is alert and in no acute distress. He responds appropriately to questions. Conjunctiva is pale. Sclera is nonicteric. Right cornea is hazy. Oropharyngeal mucosa unremarkable. Then patient in poor condition. No neck masses or thyromegaly noted. Cardiac exam with regular rhythm normal S1 and S2. No murmur or gallop noted. Lungs are clear to auscultation. Abdomen is flat. Bowel sounds are normal. On palpation is soft and nontender without organomegaly or masses. No peripheral edema or clubbing noted. Distal phalanx of left middle finger has been amputated.   Lab Results:  Recent Labs  07/20/16 1800 07/21/16 0553  WBC 7.8 7.4  HGB 5.3* 6.4*  HCT 18.9* 21.7*  PLT 384 339   BMET  Recent Labs  07/20/16 1800  NA 138  K 4.0  CL 106  CO2 24  GLUCOSE 120*  BUN 17  CREATININE 0.90  CALCIUM 8.8*   LFT  Recent Labs  07/20/16 1800  PROT 6.7  ALBUMIN 3.7  AST 14*  ALT 10*  ALKPHOS 76  BILITOT 0.4   PT/INR  Recent Labs  07/20/16 1800   LABPROT 13.8  INR 1.05    Studies/Results: Dg Chest Port 1 View  Result Date: 07/20/2016 CLINICAL DATA:  Atrial fibrillation EXAM: PORTABLE CHEST 1 VIEW COMPARISON:  09/04/2012 FINDINGS: The lungs are hyperinflated. Mild bibasilar atelectasis or scar, unchanged. No acute infiltrate or effusion. Stable cardiomediastinal silhouette. No pneumothorax. Old left lower rib fractures. IMPRESSION: Scarring or atelectasis at both lung bases. Hyperinflation.  No acute infiltrate or edema. Electronically Signed   By: Jasmine Pang M.D.   On: 07/20/2016 20:31    Assessment;  Patient is elderly African-American male who was discovered to have profound anemia on routine blood testing. Hemoglobin was 5.9 g and he is receiving third unit of PRBCs. Patient noted to have melenic heme positive stool on admission. He is on low-dose aspirin. No prior history of peptic ulcer disease. He is a smoker. Differential diagnoses include peptic ulcer disease versus GERD as well as neoplasm. I have contacted patient's daughter Ms. Deborah Crews(POA) who would like for [place] to proceed with diagnostic workup. Agree with holding ASA and PPI use.  He has history of thrombocytopenia but platelet count is now normal.  Recommendations;  Diagnostic esophagogastroduodenoscopy in a.m.   LOS: 0 days   Hanan Mcwilliams  07/21/2016, 1:44 PM

## 2016-07-21 NOTE — Progress Notes (Signed)
CRITICAL VALUE ALERT  Critical value received:  Hgb 6.5  Date of notification:  0421/2018  Time of notification:  0715  Critical value read back: yes  Nurse who received alert:  DSturdivantRN  MD notified (1st page):[place]    Time of first page:  0715  MD notified (2nd page):  Time of second page:  Responding MD:    Time MD responded:

## 2016-07-21 NOTE — Progress Notes (Signed)
PROGRESS NOTE    Cambridge Deleo  ZOX:[id number] DOB: 1919/06/26 DOA: 07/20/2016 PCP: Pearson Grippe, MD    Brief Narrative:  Brendan Walters is a 83 y.o. male with medical history of diabetes mellitus, hypertension, dementia, atrial fibrillation and depression presents from SNF with abnormal labs showing low Hgb of 5.9 done on routine blood work.  The patient is a poor historian due to history of dementia.  He knows he is in the hospital, but does not know why he is in the hospital or how he got here.  Patient denies fevers, chills, headache, chest pain, dyspnea, nausea, vomiting, diarrhea, abdominal pain, dysuria, hematuria, hematochezia, and melena.  He denies any dizziness or syncope.  It is unclear why CBC was obtained at the SNF.  In the ED, he was afebrile and hemodynamically stable with 100% saturation on RA.  BMP and LFTs were unermarkable.  Hgb was 5.3.  EDP performed rectal exam which showed melanotic stools that were heme positive. INR was 1.05.  Two units PRBCs were ordered. CXR showed bibasilar atelectasis.   Assessment & Plan:   Active Problems:   Melena   Iron deficiency anemia due to chronic blood loss   Controlled type 2 diabetes mellitus (HCC)   Essential hypertension   Dementia due to Alzheimer's disease   Chronic Blood Loss Anemia -presented with Hgb 5.3 -12/23/12 Hgb 13.6 -iron 7/tibc 305/ferritin 3 -2 units PRBCs transfused - H/H after transfusion 9.1/29.6 -IV PPI -Clear liquid diet -GI consulted will do endoscopy tomorrow -holding ASA  Diabetes Mellitus type 2 -hold metformin -novolog sliding scale -HbA1C pending  Essential HTN -continue to hold metoprolol due to soft BP  Dementia -continue seroquel -continue lexapro  Atrial Fibrillation history -rate controlled   DVT prophylaxis: SCDs Code Status: DNR Family Communication: No family at bedside Disposition Plan: pending further GI evaluation   Consultants:   GI  Procedures:    none  Antimicrobials:   None    Subjective: Patient seen this afternoon.  He is asleep but awakens to name. Denies pain.  Receiving blood transfusion currently.    Objective: Vitals:   07/21/16 0900 07/21/16 1030 07/21/16 1117 07/21/16 1400  BP: (!) 114/52 (!) 105/51 (!) 106/41 (!) 101/57  Pulse: 72 71 62 62  Resp: Temp:   97.9 F (36.6 C) 97.9 F (36.6 C)  TempSrc:   Oral Oral  SpO2:  100% 100% 100%  Weight:      Height:        Intake/Output Summary (Last 24 hours) at 07/21/16 1709 Last data filed at 07/21/16 1437  Gross per 24 hour  Intake             1216 ml  Output              675 ml  Net              541 ml   Filed Weights   07/20/16 1803 07/20/16 2304  Weight: 83 kg (183 lb) 75.1 kg (165 lb 9.1 oz)    Examination:  General exam: Appears calm and comfortable  Respiratory system: Clear to auscultation. Respiratory effort normal. Cardiovascular system: S1 & S2 heard, RRR. No JVD, murmurs, rubs, gallops or clicks. No pedal edema. Gastrointestinal system: Abdomen is nondistended, soft and nontender. No organomegaly or masses felt. Normal bowel sounds heard. Central nervous system: Alert and oriented. No focal neurological deficits. 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 Mood & affect appropriate.     Data Reviewed: I have personally reviewed following labs and imaging studies  CBC:  Recent Labs Lab 07/20/16 1800 07/21/16 0553 07/21/16 1440  WBC 7.8 7.4 8.6  NEUTROABS 5.0  --   --   HGB 5.3* 6.4* 9.1*  HCT 18.9* 21.7* 29.6*  MCV 67.7* 68.7* 72.5*  PLT 384 339 316   Basic Metabolic Panel:  Recent Labs Lab 07/20/16 1800  NA 138  K 4.0  CL 106  CO2 24  GLUCOSE 120*  BUN 17  CREATININE 0.90  CALCIUM 8.8*   GFR: Estimated Creatinine Clearance: 53.3 mL/min (by C-G formula based on SCr of 0.9  mg/dL). Liver Function Tests:  Recent Labs Lab 07/20/16 1800  AST 14*  ALT 10*  ALKPHOS 76  BILITOT 0.4  PROT 6.7  ALBUMIN 3.7   No results for input(s): LIPASE, AMYLASE in the last 168 hours. No results for input(s): AMMONIA in the last 168 hours. Coagulation Profile:  Recent Labs Lab 07/20/16 1800  INR 1.05   Cardiac Enzymes: No results for input(s): CKTOTAL, CKMB, CKMBINDEX, TROPONINI in the last 168 hours. BNP (last 3 results) No results for input(s): PROBNP in the last 8760 hours. HbA1C: No results for input(s): HGBA1C in the last 72 hours. CBG:  Recent Labs Lab 07/20/16 2314 07/21/16 0753 07/21/16 1219  GLUCAP 122* 113* 147*   Lipid Profile: No results for input(s): CHOL, HDL, LDLCALC, TRIG, CHOLHDL, LDLDIRECT in the last 72 hours. Thyroid Function Tests: No results for input(s): TSH, T4TOTAL, FREET4, T3FREE, THYROIDAB in the last 72 hours. Anemia Panel:  Recent Labs  07/21/16 0548 07/21/16 0553  FERRITIN 3*  --   TIBC 305  --   IRON 7*  --   RETICCTPCT  --  1.3   Sepsis Labs: No results for input(s): PROCALCITON, LATICACIDVEN in the last 168 hours.  Recent Results (from the past 240 hour(s))  MRSA PCR Screening     Status: None   Collection Time: 07/20/16 11:39 PM  Result Value Ref Range Status   MRSA by PCR NEGATIVE NEGATIVE Final    Comment:        The GeneXpert MRSA Assay (FDA approved for NASAL specimens only), is one component of a comprehensive MRSA colonization surveillance program. It is not intended to diagnose MRSA infection nor to guide or monitor treatment for MRSA infections.          Radiology Studies: Dg Chest Port 1 View  Result Date: 07/20/2016 CLINICAL DATA:  Atrial fibrillation EXAM: PORTABLE CHEST 1 VIEW COMPARISON:  09/04/2012 FINDINGS: The lungs are hyperinflated. Mild bibasilar atelectasis or scar, unchanged. No acute infiltrate or effusion. Stable cardiomediastinal silhouette. No pneumothorax. Old left  lower rib fractures. IMPRESSION: Scarring or atelectasis at both lung bases. Hyperinflation. No acute infiltrate or edema. Electronically Signed   By: Jasmine Pang M.D.   On: 07/20/2016 20:31        Scheduled Meds: . atorvastatin  10 mg Oral Daily  . insulin aspart  0-9 Units Subcutaneous TID WC  . pantoprazole (PROTONIX) IV  40 mg Intravenous Q24H  . [START ON 07/25/2016] Vitamin D (Ergocalciferol)  50,000 Units Oral Q7 days   Continuous Infusions: . sodium chloride 10 mL/hr (07/21/16 0035)     LOS: 0 days    Time spent: 30 minutes    Katrinka Blazing, MD [facility] Pager [phone]  If 7PM-7AM, please  contact night-coverage [email or web address] Password [email or web address] 07/21/2016, 5:09 PM

## 2016-07-21 NOTE — Progress Notes (Signed)
Verbal consent obtained via telephone from pt's daughter and Melina Modena, Jannette Spanner [phone]), for EGD tomorrow.

## 2016-07-22 ENCOUNTER — Encounter (HOSPITAL_COMMUNITY)
Admission: EM | Disposition: A | Discharge status: Skilled Nursing Facility | Payer: Self-pay | Source: Home / Self Care | Attending: Emergency Medicine

## 2016-07-22 DIAGNOSIS — I10 Essential (primary) hypertension: Secondary | ICD-10-CM | POA: Diagnosis not present

## 2016-07-22 DIAGNOSIS — E1136 Type 2 diabetes mellitus with diabetic cataract: Secondary | ICD-10-CM

## 2016-07-22 DIAGNOSIS — K3189 Other diseases of stomach and duodenum: Secondary | ICD-10-CM

## 2016-07-22 DIAGNOSIS — D500 Iron deficiency anemia secondary to blood loss (chronic): Secondary | ICD-10-CM | POA: Diagnosis not present

## 2016-07-22 DIAGNOSIS — K921 Melena: Secondary | ICD-10-CM | POA: Diagnosis not present

## 2016-07-22 DIAGNOSIS — D649 Anemia, unspecified: Secondary | ICD-10-CM | POA: Diagnosis not present

## 2016-07-22 DIAGNOSIS — K31819 Angiodysplasia of stomach and duodenum without bleeding: Secondary | ICD-10-CM

## 2016-07-22 DIAGNOSIS — K922 Gastrointestinal hemorrhage, unspecified: Secondary | ICD-10-CM | POA: Diagnosis not present

## 2016-07-22 HISTORY — PX: ESOPHAGOGASTRODUODENOSCOPY: SHX5428

## 2016-07-22 LAB — BASIC METABOLIC PANEL
ANION GAP: 7 (ref 5–15)
BUN: 9 mg/dL (ref 6–20)
CO2: 24 mmol/L (ref 22–32)
Calcium: 8.7 mg/dL — ABNORMAL LOW (ref 8.9–10.3)
Chloride: 104 mmol/L (ref 101–111)
Creatinine, Ser: 0.67 mg/dL (ref 0.61–1.24)
GFR calc Af Amer: >60 mL/min (ref >60)
GFR calc non Af Amer: >60 mL/min (ref >60)
Glucose, Bld: 127 mg/dL — ABNORMAL HIGH (ref 65–99)
POTASSIUM: 3.7 mmol/L (ref 3.5–5.1)
Sodium: 135 mmol/L (ref 135–145)

## 2016-07-22 LAB — CBC
HEMATOCRIT: 28.4 % — AB (ref 39.0–52.0)
Hemoglobin: 8.9 g/dL — ABNORMAL LOW (ref 13.0–17.0)
MCH: 22.4 pg — ABNORMAL LOW (ref 26.0–34.0)
MCHC: 31.3 g/dL (ref 30.0–36.0)
MCV: 71.4 fL — AB (ref 78.0–100.0)
Platelets: 281 10*3/uL (ref 150–400)
RBC: 3.98 MIL/uL — ABNORMAL LOW (ref 4.22–5.81)
RDW: 19.3 % — AB (ref 11.5–15.5)
WBC: 8.5 10*3/uL (ref 4.0–10.5)

## 2016-07-22 LAB — TYPE AND SCREEN
ABO/RH(D): O POS
ANTIBODY SCREEN: NEGATIVE
Unit division: 0
Unit division: 0
Unit division: 0

## 2016-07-22 LAB — BPAM RBC
BLOOD PRODUCT EXPIRATION DATE: 201805152359
Blood Product Expiration Date: 201805112359
Blood Product Expiration Date: 201805152359
ISSUE DATE / TIME: 201804202135
ISSUE DATE / TIME: 201804210742
ISSUE DATE / TIME: 201804211051
Unit Type and Rh: 5100
Unit Type and Rh: 5100
Unit Type and Rh: 5100

## 2016-07-22 LAB — GLUCOSE, CAPILLARY
GLUCOSE-CAPILLARY: 108 mg/dL — AB (ref 65–99)
Glucose-Capillary: 135 mg/dL — ABNORMAL HIGH (ref 65–99)
Glucose-Capillary: 163 mg/dL — ABNORMAL HIGH (ref 65–99)
Glucose-Capillary: 138 mg/dL — ABNORMAL HIGH (ref 65–99)

## 2016-07-22 LAB — HEMOGLOBIN A1C
Hgb A1c MFr Bld: 6 % — ABNORMAL HIGH (ref 4.8–5.6)
Mean Plasma Glucose: 126 mg/dL

## 2016-07-22 SURGERY — EGD (ESOPHAGOGASTRODUODENOSCOPY)
Anesthesia: Moderate Sedation

## 2016-07-22 MED ORDER — LIDOCAINE VISCOUS 2 % MT SOLN
OROMUCOSAL | Status: DC | PRN
  Administered 2016-07-22: 1 via OROMUCOSAL

## 2016-07-22 MED ORDER — MIDAZOLAM HCL 5 MG/5ML IJ SOLN
INTRAMUSCULAR | Status: AC
  Filled 2016-07-22: qty 10

## 2016-07-22 MED ORDER — PANTOPRAZOLE SODIUM 40 MG PO TBEC
40.0000 mg | DELAYED_RELEASE_TABLET | Freq: Two times a day (BID) | ORAL | Status: DC
  Administered 2016-07-22 – 2016-07-23 (×2): 40 mg via ORAL
  Filled 2016-07-22: qty 1

## 2016-07-22 MED ORDER — MEPERIDINE HCL 50 MG/ML IJ SOLN
INTRAMUSCULAR | Status: AC
  Filled 2016-07-22: qty 1

## 2016-07-22 MED ORDER — LIDOCAINE VISCOUS 2 % MT SOLN
OROMUCOSAL | Status: AC
  Filled 2016-07-22: qty 15

## 2016-07-22 MED ORDER — SUCRALFATE 1 GM/10ML PO SUSP
1.0000 g | Freq: Three times a day (TID) | ORAL | Status: DC
  Administered 2016-07-22 – 2016-07-23 (×5): 1 g via ORAL
  Filled 2016-07-22 (×4): qty 10

## 2016-07-22 MED ORDER — MIDAZOLAM HCL 5 MG/5ML IJ SOLN
INTRAMUSCULAR | Status: DC | PRN
  Administered 2016-07-22 (×2): 1 mg via INTRAVENOUS

## 2016-07-22 MED ORDER — FERROUS SULFATE 325 (65 FE) MG PO TABS
325.0000 mg | ORAL_TABLET | Freq: Every day | ORAL | Status: DC
  Administered 2016-07-23: 325 mg via ORAL
  Filled 2016-07-22: qty 1

## 2016-07-22 NOTE — Progress Notes (Signed)
Brief EGD note:  Small sliding hiatal hernia. Multiple gastric AV malformations. 13 lesions ablated with APC. Nodular mucosa at gastric body. Biopsy taken. Single erosion at antrum. 5 bulbar AV malformations ablated with APC.

## 2016-07-22 NOTE — Progress Notes (Signed)
PROGRESS NOTE    Brendan Walters  ZOX:[id number] DOB: [birth year] DOA: 07/20/2016 PCP: Pearson Grippe, MD    Brief Narrative:  Brendan Walters is a 83 y.o. male with medical history of diabetes mellitus, hypertension, dementia, atrial fibrillation and depression presents from SNF with abnormal labs showing low Hgb of 5.9 done on routine blood work.  The patient is a poor historian due to history of dementia.  He knows he is in the hospital, but does not know why he is in the hospital or how he got here.  Patient denies fevers, chills, headache, chest pain, dyspnea, nausea, vomiting, diarrhea, abdominal pain, dysuria, hematuria, hematochezia, and melena.  He denies any dizziness or syncope.  It is unclear why CBC was obtained at the SNF.  In the ED, he was afebrile and hemodynamically stable with 100% saturation on RA.  BMP and LFTs were unermarkable.  Hgb was 5.3.  EDP performed rectal exam which showed melanotic stools that were heme positive. INR was 1.05.  Two units PRBCs were ordered. CXR showed bibasilar atelectasis.  Gastroenterology was consulted and patient underwent EGD on 4/22.  Numerous AVM's noted on EGD.   Assessment & Plan:   Active Problems:   Melena   Iron deficiency anemia due to chronic blood loss   Controlled type 2 diabetes mellitus (HCC)   Essential hypertension   Dementia due to Alzheimer's disease   Chronic Blood Loss Anemia - s/p blood transfusion - H/H 8.9/28.4 -IV PPI - Carb modified diet - hold aspirin  Diabetes Mellitus type 2 -hold metformin -novolog sliding scale  Essential HTN -continue to hold metoprolol due to soft BP - may start low dose metoprolol  Dementia -continue seroquel -continue lexapro  Atrial Fibrillation history -rate controlled   DVT prophylaxis: SCDs Code Status: DNR Family Communication: No family at bedside Disposition Plan: pending improvement, may be able to discharge tomorrow if cleared by GI   Consultants:    GI  Procedures:   EGD today  Antimicrobials:   None    Subjective: Patient denies abdominal pain.  States he feels well and slept well.  Asking which door is in the bathroom in his room.  Objective: Vitals:   07/22/16 1105 07/22/16 1110 07/22/16 1115 07/22/16 1120  BP: 122/68 122/66 120/65 124/64  Pulse: (!) 59 70 66 65  Resp: 20 (!) Temp:      TempSrc:      SpO2: 99% 98% 100% 100%  Weight:      Height:        Intake/Output Summary (Last 24 hours) at 07/22/16 1157 Last data filed at 07/22/16 0900  Gross per 24 hour  Intake                0 ml  Output              475 ml  Net             -475 ml   Filed Weights   07/20/16 1803 07/20/16 2304  Weight: 83 kg (183 lb) 75.1 kg (165 lb 9.1 oz)    Examination:  General exam: Appears calm and comfortable  Respiratory system: Clear to auscultation. Respiratory effort normal. Cardiovascular system: S1 & S2 heard, RRR. No JVD, murmurs, rubs, gallops or clicks. No pedal edema. Gastrointestinal system: Abdomen is nondistended, soft and nontender. No organomegaly or masses felt. Normal bowel sounds heard. Central nervous system: Not oriented but alert. No focal neurological deficits. Extremities: Symmetric 5 x  5 power, fingertip on left middle finger has been amputated Skin: No rashes, lesions or ulcers Psychiatry: Mood & affect appropriate.  Limited insight.    Data Reviewed: I have personally reviewed following labs and imaging studies  CBC:  Recent Labs Lab 07/20/16 1800 07/21/16 0553 07/21/16 1440 07/22/16 0754  WBC 7.8 7.4 8.6 8.5  NEUTROABS 5.0  --   --   --   HGB 5.3* 6.4* 9.1* 8.9*  HCT 18.9* 21.7* 29.6* 28.4*  MCV 67.7* 68.7* 72.5* 71.4*  PLT 384 339 316 281   Basic Metabolic Panel:  Recent Labs Lab 07/20/16 1800 07/22/16 0754  NA 138 135  K 4.0 3.7  CL 106 104  CO2 24 24  GLUCOSE 120* 127*  BUN 17 9  CREATININE 0.90 0.67  CALCIUM 8.8* 8.7*   GFR: Estimated Creatinine  Clearance: 60 mL/min (by C-G formula based on SCr of 0.67 mg/dL). Liver Function Tests:  Recent Labs Lab 07/20/16 1800  AST 14*  ALT 10*  ALKPHOS 76  BILITOT 0.4  PROT 6.7  ALBUMIN 3.7   No results for input(s): LIPASE, AMYLASE in the last 168 hours. No results for input(s): AMMONIA in the last 168 hours. Coagulation Profile:  Recent Labs Lab 07/20/16 1800  INR 1.05   Cardiac Enzymes: No results for input(s): CKTOTAL, CKMB, CKMBINDEX, TROPONINI in the last 168 hours. BNP (last 3 results) No results for input(s): PROBNP in the last 8760 hours. HbA1C: No results for input(s): HGBA1C in the last 72 hours. CBG:  Recent Labs Lab 07/21/16 1219 07/21/16 1736 07/21/16 2218 07/22/16 0747 07/22/16 1154  GLUCAP 147* 112* 133* 135* 108*   Lipid Profile: No results for input(s): CHOL, HDL, LDLCALC, TRIG, CHOLHDL, LDLDIRECT in the last 72 hours. Thyroid Function Tests: No results for input(s): TSH, T4TOTAL, FREET4, T3FREE, THYROIDAB in the last 72 hours. Anemia Panel:  Recent Labs  07/21/16 0548 07/21/16 0553  FERRITIN 3*  --   TIBC 305  --   IRON 7*  --   RETICCTPCT  --  1.3   Sepsis Labs: No results for input(s): PROCALCITON, LATICACIDVEN in the last 168 hours.  Recent Results (from the past 240 hour(s))  MRSA PCR Screening     Status: None   Collection Time: 07/20/16 11:39 PM  Result Value Ref Range Status   MRSA by PCR NEGATIVE NEGATIVE Final    Comment:        The GeneXpert MRSA Assay (FDA approved for NASAL specimens only), is one component of a comprehensive MRSA colonization surveillance program. It is not intended to diagnose MRSA infection nor to guide or monitor treatment for MRSA infections.          Radiology Studies: Dg Chest Port 1 View  Result Date: 07/20/2016 CLINICAL DATA:  Atrial fibrillation EXAM: PORTABLE CHEST 1 VIEW COMPARISON:  09/04/2012 FINDINGS: The lungs are hyperinflated. Mild bibasilar atelectasis or scar, unchanged. No  acute infiltrate or effusion. Stable cardiomediastinal silhouette. No pneumothorax. Old left lower rib fractures. IMPRESSION: Scarring or atelectasis at both lung bases. Hyperinflation. No acute infiltrate or edema. Electronically Signed   By: Jasmine Pang M.D.   On: 07/20/2016 20:31        Scheduled Meds: . lidocaine      . meperidine      . midazolam      . atorvastatin  10 mg Oral Daily  . [START ON 07/23/2016] ferrous sulfate  325 mg Oral Q breakfast  . insulin aspart  0-9 Units Subcutaneous  TID WC  . pantoprazole  40 mg Oral BID AC  . sucralfate  1 g Oral TID WC & HS  . [START ON 07/25/2016] Vitamin D (Ergocalciferol)  50,000 Units Oral Q7 days   Continuous Infusions:    LOS: 0 days    Time spent: 30 minutes    Katrinka Blazing, MD [facility] Pager [phone]  If 7PM-7AM, please contact night-coverage [email or web address] Password [facility] 07/22/2016, 11:57 AM

## 2016-07-22 NOTE — Op Note (Signed)
[facility] Patient Name: Brendan Walters Procedure Date: 07/22/2016 10:36 AM MRN: [id number] Date of Birth: June 02, 1919 Attending MD: Lionel December , MD CSN: [id number] Age: 83 Admit Type: Inpatient Procedure:                Upper GI endoscopy Indications:              Iron deficiency anemia secondary to chronic blood                            loss, Melena Providers:                Lionel December, MD, Nena Polio, RN, Judee Clara, RN Referring MD:             Filbert Schilder, MD Medicines:                Lidocaine spray, Midazolam 3 mg IV Complications:            No immediate complications. Estimated Blood Loss:     Estimated blood loss was minimal. Procedure:                Pre-Anesthesia Assessment:                           - Prior to the procedure, a History and Physical                            was performed, and patient medications and                            allergies were reviewed. The patient's tolerance of                            previous anesthesia was also reviewed. The risks                            and benefits of the procedure and the sedation                            options and risks were discussed with the patient.                            All questions were answered, and informed consent                            was obtained. Prior Anticoagulants: The patient                            last took aspirin 2 days prior to the procedure.                            ASA Grade Assessment: III - A patient with severe                            systemic disease. After reviewing the risks and  benefits, the patient was deemed in satisfactory                            condition to undergo the procedure.                           After obtaining informed consent, the endoscope was                            passed under direct vision. Throughout the                            procedure, the patient's blood pressure, pulse,  and                            oxygen saturations were monitored continuously. The                            [id number] ([id number]) scope was introduced through the                            mouth, and advanced to the second part of duodenum.                            The upper GI endoscopy was accomplished without                            difficulty. The patient tolerated the procedure                            well. Scope In: 11:01:10 AM Scope Out: 11:21:40 AM Total Procedure Duration: 0 hours 20 minutes 30 seconds  Findings:      The examined esophagus was normal.      The Z-line was regular and was found 42 cm from the incisors.      A 2 cm hiatal hernia was present.      Red blood was found in the gastric body.      Patchy nodular mucosa was found in the gastric body. Biopsies were taken       with a cold forceps for histology.      Multiple no bleeding angioectasias were found in the gastric fundus and       in the gastric body. Coagulation for bleeding prevention using argon       plasma was successful.      A single erosion was found in the gastric antrum.      The pylorus was normal.      Five 3 to 8 mm angiodysplastic lesions without bleeding were found in       the duodenal bulb. Coagulation for bleeding prevention using argon       plasma was successful. Impression:               - Normal esophagus.                           - Z-line regular, 42 cm from the incisors.                           -  2 cm hiatal hernia.                           - Red blood in the gastric body.                           - Nodular mucosa in the gastric body. Biopsied.                           - Multiple non-bleeding angioectasias in the                            stomach. Treated with argon plasma coagulation                            (APC).                           - Erosive gastropathy.                           - Normal pylorus.                           - Five non-bleeding  angiodysplastic lesions in the                            duodenum. Treated with argon plasma coagulation                            (APC). Moderate Sedation:      Moderate (conscious) sedation was administered by the endoscopy nurse       and supervised by the endoscopist. The following parameters were       monitored: oxygen saturation, heart rate, blood pressure, CO2       capnography and response to care. Total physician intraservice time was       27 minutes. Recommendation:           - Return patient to hospital ward for ongoing care.                           - Diabetic (ADA) diet today.                           - Continue present medications.                           - No aspirin, ibuprofen, naproxen, or other                            non-steroidal anti-inflammatory drugs.                           - Repeat upper endoscopy PRN.                           - Ferrous sulfate at 325 mg orally daily. Procedure Code(s):        ---  Professional ---                           [phone], Esophagogastroduodenoscopy, flexible,                            transoral; with biopsy, single or multiple                           99152, Moderate sedation services provided by the                            same physician or other qualified health care                            professional performing the diagnostic or                            therapeutic service that the sedation supports,                            requiring the presence of an independent trained                            observer to assist in the monitoring of the                            patient's level of consciousness and physiological                            status; initial 15 minutes of intraservice time,                            patient age 8 years or older                           [phone], Moderate sedation services; each additional                            15 minutes intraservice time Diagnosis Code(s):         --- Professional ---                           K44.9, Diaphragmatic hernia without obstruction or                            gangrene                           K92.2, Gastrointestinal hemorrhage, unspecified                           K31.89, Other diseases of stomach and duodenum                           K31.819, Angiodysplasia of stomach and duodenum  without bleeding                           D50.0, Iron deficiency anemia secondary to blood                            loss (chronic)                           K92.1, Melena (includes Hematochezia) CPT copyright 2016 [employer]. All rights reserved. The codes documented in this report are preliminary and upon coder review may  be revised to meet current compliance requirements. Lionel December, MD Lionel December, MD 07/22/2016 11:36:49 AM This report has been signed electronically. Number of Addenda: 0

## 2016-07-23 DIAGNOSIS — K3189 Other diseases of stomach and duodenum: Secondary | ICD-10-CM | POA: Diagnosis not present

## 2016-07-23 DIAGNOSIS — D649 Anemia, unspecified: Secondary | ICD-10-CM | POA: Diagnosis not present

## 2016-07-23 DIAGNOSIS — D500 Iron deficiency anemia secondary to blood loss (chronic): Secondary | ICD-10-CM | POA: Diagnosis not present

## 2016-07-23 DIAGNOSIS — K922 Gastrointestinal hemorrhage, unspecified: Secondary | ICD-10-CM | POA: Diagnosis not present

## 2016-07-23 DIAGNOSIS — K921 Melena: Secondary | ICD-10-CM | POA: Diagnosis not present

## 2016-07-23 DIAGNOSIS — E1136 Type 2 diabetes mellitus with diabetic cataract: Secondary | ICD-10-CM | POA: Diagnosis not present

## 2016-07-23 DIAGNOSIS — G309 Alzheimer's disease, unspecified: Secondary | ICD-10-CM | POA: Diagnosis not present

## 2016-07-23 DIAGNOSIS — I10 Essential (primary) hypertension: Secondary | ICD-10-CM | POA: Diagnosis not present

## 2016-07-23 LAB — CBC
HCT: 30.3 % — ABNORMAL LOW (ref 39.0–52.0)
Hemoglobin: 9.2 g/dL — ABNORMAL LOW (ref 13.0–17.0)
MCH: 21.9 pg — ABNORMAL LOW (ref 26.0–34.0)
MCHC: 30.4 g/dL (ref 30.0–36.0)
MCV: 72 fL — ABNORMAL LOW (ref 78.0–100.0)
PLATELETS: 296 10*3/uL (ref 150–400)
RBC: 4.21 MIL/uL — ABNORMAL LOW (ref 4.22–5.81)
RDW: 19.9 % — ABNORMAL HIGH (ref 11.5–15.5)
WBC: 12.7 10*3/uL — ABNORMAL HIGH (ref 4.0–10.5)

## 2016-07-23 LAB — GLUCOSE, CAPILLARY
GLUCOSE-CAPILLARY: 140 mg/dL — AB (ref 65–99)
Glucose-Capillary: 141 mg/dL — ABNORMAL HIGH (ref 65–99)

## 2016-07-23 MED ORDER — PANTOPRAZOLE SODIUM 40 MG PO TBEC: 40.0000 mg | DELAYED_RELEASE_TABLET | Freq: Two times a day (BID) | ORAL | 0 refills | Status: AC

## 2016-07-23 MED ORDER — FERROUS SULFATE 325 (65 FE) MG PO TABS: 325.0000 mg | ORAL_TABLET | Freq: Every day | ORAL | 0 refills | Status: AC

## 2016-07-23 NOTE — Discharge Summary (Signed)
Physician Discharge Summary  Brendan Walters ZOX:[id number] DOB: 1919/11/20 DOA: 07/20/2016  PCP: Pearson Grippe, MD  Admit date: 07/20/2016 Discharge date: 07/23/2016  Admitted From: SNF- [facility]  Disposition:  SNF- [facility]   Recommendations for Outpatient Follow-up:  1. Follow up with PCP in 1-2 weeks 2. Follow up with Dr. Karilyn Cota for results of your biopsies 3. Get repeat urinalysis tomorrow 4. Please obtain BMP/CBC in one week   Home Health: No Equipment/Devices: None  Discharge Condition: Stable  CODE STATUS: DNR  Diet recommendation: Diabetic diet   Brief/Interim Summary: Brendan Walters a 83 y.o.malewith medical history of diabetes mellitus, hypertension, dementia, atrial fibrillation and depression presents from SNF with abnormal labs showing low Hgb of 5.9 done on routine blood work. The patient is a poor historian due to history of dementia. He knows he is in the hospital, but does not know why he is in the hospital or how he got here. Patient denies fevers, chills, headache, chest pain, dyspnea, nausea, vomiting, diarrhea, abdominal pain, dysuria, hematuria, hematochezia, and melena. He denies any dizziness or syncope. It is unclear why CBC was obtained at the SNF.  In the ED, he was afebrile and hemodynamically stable with 100% saturation on RA. BMP and LFTs were unermarkable. Hgb was 5.3. EDP performed rectal exam which showed melanotic stools that were heme positive. INR was 1.05. Two units PRBCs were ordered. CXR showed bibasilar atelectasis.  Gastroenterology was consulted and patient underwent EGD on 4/22.  Numerous AVM's noted on EGD.  Patient tolerated diet and was stable for discharge on 07/23/16.  He will need PO iron and Dr. Karilyn Cota will follow up concerning results of biopsies.  Discharge Diagnoses:  Active Problems:   Melena   Iron deficiency anemia due to chronic blood loss   Controlled type 2 diabetes mellitus (HCC)   Essential hypertension   Dementia due  to Alzheimer's disease    Discharge Instructions  Discharge Instructions    Call MD for:  difficulty breathing, headache or visual disturbances    Complete by:  As directed    Call MD for:  extreme fatigue    Complete by:  As directed    Call MD for:  hives    Complete by:  As directed    Call MD for:  persistant dizziness or light-headedness    Complete by:  As directed    Call MD for:  persistant nausea and vomiting    Complete by:  As directed    Call MD for:  severe uncontrolled pain    Complete by:  As directed    Call MD for:  temperature >100.4    Complete by:  As directed    Diet - low sodium heart healthy    Complete by:  As directed    Discharge instructions    Complete by:  As directed    Iron supplement daily Follow up on biopsies with Dr. Karilyn Cota   Increase activity slowly    Complete by:  As directed      Allergies as of 07/23/2016   No Known Allergies     Medication List    TAKE these medications   acetaminophen 650 MG CR tablet Commonly known as:  TYLENOL Take 650 mg by mouth every 8 (eight) hours as needed for pain.   aspirin EC 81 MG tablet Take 81 mg by mouth daily.   atorvastatin 10 MG tablet Commonly known as:  LIPITOR Take 10 mg by mouth daily.   ferrous sulfate 325 (  65 FE) MG tablet Take 1 tablet (325 mg total) by mouth daily with breakfast. Start taking on:  07/24/2016   ipratropium-albuterol 0.5-2.5 (3) MG/3ML Soln Commonly known as:  DUONEB Take 3 mLs by nebulization daily as needed (for shortness of breath).   metFORMIN 1000 MG tablet Commonly known as:  GLUCOPHAGE Take 1,000 mg by mouth 2 (two) times daily with a meal.   pantoprazole 40 MG tablet Commonly known as:  PROTONIX Take 1 tablet (40 mg total) by mouth 2 (two) times daily before a meal.   Vitamin D (Ergocalciferol) 50000 units Caps capsule Commonly known as:  DRISDOL Take 50,000 Units by mouth every 7 (seven) days.      Follow-up Information    Lionel December,  MD. Schedule an appointment as soon as possible for a visit in 2 week(s).   Specialty:  Gastroenterology Contact information: [address] [place] [address] [phone]        Pearson Grippe, MD. Schedule an appointment as soon as possible for a visit in 2 week(s).   Specialty:  Internal Medicine Contact information: [address] [place] [address] [phone]          No Known Allergies  Consultations:  Gastroenterology   Procedures/Studies: Dg Chest Port 1 View  Result Date: 07/20/2016 CLINICAL DATA:  Atrial fibrillation EXAM: PORTABLE CHEST 1 VIEW COMPARISON:  09/04/2012 FINDINGS: The lungs are hyperinflated. Mild bibasilar atelectasis or scar, unchanged. No acute infiltrate or effusion. Stable cardiomediastinal silhouette. No pneumothorax. Old left lower rib fractures. IMPRESSION: Scarring or atelectasis at both lung bases. Hyperinflation. No acute infiltrate or edema. Electronically Signed   By: Jasmine Pang M.D.   On: 07/20/2016 20:31   EGD: Small sliding hiatal hernia. Multiple gastric AV malformations. 13 lesions ablated with APC. Nodular mucosa at gastric body. Biopsy taken. Single erosion at antrum. 5 bulbar AV malformations ablated with APC   Subjective: Patient reports he feels very well.  Denies any abdominal pain.  Voices that he ate breakfast.  Discharge Exam: Vitals:   07/22/16 2252 07/23/16 0500  BP: (!) 113/48 (!) 113/50  Pulse: 70 69  Resp: 18 20  Temp: 98.6 F (37 C) 98 F (36.7 C)   Vitals:   07/22/16 1115 07/22/16 1120 07/22/16 2252 07/23/16 0500  BP: 120/65 124/64 (!) 113/48 (!) 113/50  Pulse: 66 65 70 69  Resp: Temp:   98.6 F (37 C) 98 F (36.7 C)  TempSrc:   Oral Oral  SpO2: 100% 100% 100% 100%  Weight:      Height:        General: Pt is alert, awake, not in acute distress Cardiovascular: RRR, S1/S2 +, no rubs, no gallops Respiratory: CTA bilaterally, no wheezing, no  rhonchi Abdominal: Soft, NT, ND, bowel sounds + Extremities: no edema, no cyanosis    The results of significant diagnostics from this hospitalization (including imaging, microbiology, ancillary and laboratory) are listed below for reference.     Microbiology: Recent Results (from the past 240 hour(s))  MRSA PCR Screening     Status: None   Collection Time: 07/20/16 11:39 PM  Result Value Ref Range Status   MRSA by PCR NEGATIVE NEGATIVE Final    Comment:        The GeneXpert MRSA Assay (FDA approved for NASAL specimens only), is one component of a comprehensive MRSA colonization surveillance program. It is not intended to diagnose MRSA infection nor to guide or  monitor treatment for MRSA infections.      Labs: BNP (last 3 results) No results for input(s): BNP in the last 8760 hours. Basic Metabolic Panel:  Recent Labs Lab 07/20/16 1800 07/22/16 0754  NA 138 135  K 4.0 3.7  CL 106 104  CO2 24 24  GLUCOSE 120* 127*  BUN 17 9  CREATININE 0.90 0.67  CALCIUM 8.8* 8.7*   Liver Function Tests:  Recent Labs Lab 07/20/16 1800  AST 14*  ALT 10*  ALKPHOS 76  BILITOT 0.4  PROT 6.7  ALBUMIN 3.7   No results for input(s): LIPASE, AMYLASE in the last 168 hours. No results for input(s): AMMONIA in the last 168 hours. CBC:  Recent Labs Lab 07/20/16 1800 07/21/16 0553 07/21/16 1440 07/22/16 0754 07/23/16 0830  WBC 7.8 7.4 8.6 8.5 12.7*  NEUTROABS 5.0  --   --   --   --   HGB 5.3* 6.4* 9.1* 8.9* 9.2*  HCT 18.9* 21.7* 29.6* 28.4* 30.3*  MCV 67.7* 68.7* 72.5* 71.4* 72.0*  PLT 384 339 316 281 296   Cardiac Enzymes: No results for input(s): CKTOTAL, CKMB, CKMBINDEX, TROPONINI in the last 168 hours. BNP: Invalid input(s): POCBNP CBG:  Recent Labs Lab 07/22/16 1154 07/22/16 1639 07/22/16 2256 07/23/16 0734 07/23/16 1119  GLUCAP 108* 163* 138* 141* 140*   D-Dimer No results for input(s): DDIMER in the last 72 hours. Hgb A1c  Recent Labs   07/21/16 0553  HGBA1C 6.0*   Lipid Profile No results for input(s): CHOL, HDL, LDLCALC, TRIG, CHOLHDL, LDLDIRECT in the last 72 hours. Thyroid function studies No results for input(s): TSH, T4TOTAL, T3FREE, THYROIDAB in the last 72 hours.  Invalid input(s): FREET3 Anemia work up  Recent Labs  07/21/16 0548 07/21/16 0553  FERRITIN 3*  --   TIBC 305  --   IRON 7*  --   RETICCTPCT  --  1.3   Urinalysis    Component Value Date/Time   COLORURINE YELLOW 07/21/2016 2258   APPEARANCEUR CLEAR 07/21/2016 2258   LABSPEC 1.016 07/21/2016 2258   PHURINE 5.0 07/21/2016 2258   GLUCOSEU NEGATIVE 07/21/2016 2258   HGBUR NEGATIVE 07/21/2016 2258   BILIRUBINUR NEGATIVE 07/21/2016 2258   KETONESUR NEGATIVE 07/21/2016 2258   PROTEINUR NEGATIVE 07/21/2016 2258   UROBILINOGEN 0.2 12/23/2012 1433   NITRITE POSITIVE (A) 07/21/2016 2258   LEUKOCYTESUR SMALL (A) 07/21/2016 2258   Sepsis Labs Invalid input(s): PROCALCITONIN,  WBC,  LACTICIDVEN Microbiology Recent Results (from the past 240 hour(s))  MRSA PCR Screening     Status: None   Collection Time: 07/20/16 11:39 PM  Result Value Ref Range Status   MRSA by PCR NEGATIVE NEGATIVE Final    Comment:        The GeneXpert MRSA Assay (FDA approved for NASAL specimens only), is one component of a comprehensive MRSA colonization surveillance program. It is not intended to diagnose MRSA infection nor to guide or monitor treatment for MRSA infections.      Time coordinating discharge: 35 minutes  SIGNED:   Katrinka Blazing, MD  [facility] 07/23/2016, 11:44 AM Pager [phone] If 7PM-7AM, please contact night-coverage [email or web address] Password [email or web address]

## 2016-07-23 NOTE — Progress Notes (Signed)
LCSW following for disposition planning. Pt from [facility] for long term care.   Spoke with Debbie at [facility], aware pt planning to d/c today and facility prepared to accept pt back.   Spoke with pt's daughter [phone] Eunice Blase, (in [place]) who is aware of plan, states she will call her father at [facility] to check in with him tonight.  Will send all info to the facility through the [facility] Report #: [phone]  Plan: DC to [facility] LT today.  Ilean Skill, MSW, LCSW Clinical Social Work 07/23/2016 [phone]

## 2016-07-23 NOTE — Progress Notes (Addendum)
Pt's IV catheter removed and intact. Pt's IV site clean dry and intact. Report called and given to Devetta ([facility], [place]). All questions were answered and no further questions at this time. Pt in stable condition and in no acute distress at time of discharge. Pt will be transported via [facility]

## 2016-07-23 NOTE — Progress Notes (Signed)
Patient has no complaints. Denies nausea vomiting or abdominal pain. He had one bowel movement last evening. Patient is alert and in no acute distress Abdomen is soft and nontender. H&H 9.2 and 30.3. 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 Patient underwent therapeutic EGD yesterday with APC ablation of 13 gastric and 5 duodenal AV malformations. Patient has received 3 units of PRBCs and H&H is stable.  Recommendations:  No NSAIDs. Continue by mouth iron. Continue PPI. H&H in one week. I wil contact patient's daughter with biopsy results later this week.

## 2016-07-23 NOTE — Care Management Note (Signed)
Case Management Note  Patient Details  Name: Brendan Walters MRN: [id number] Date of Birth: 10-21-1919  Subjective/Objective:                  Pt from [facility]. Admitted with melena.   Action/Plan: Pt discharging back to SNF today CSW has made arrangements for return to facility.   Expected Discharge Date:  07/23/16               Expected Discharge Plan:  Skilled Nursing Facility  In-House Referral:  Clinical Social Work  Discharge planning Services  CM Consult  Post Acute Care Choice:  NA Choice offered to:  NA  Status of Service:  Completed, signed off  Malcolm Metro, RN 07/23/2016, 11:40 AM

## 2016-07-25 ENCOUNTER — Encounter (HOSPITAL_COMMUNITY): Payer: Self-pay | Admitting: Internal Medicine

## 2016-08-01 ENCOUNTER — Telehealth: Payer: Self-pay | Admitting: Family Medicine

## 2016-08-01 NOTE — Telephone Encounter (Signed)
done

## 2016-08-06 ENCOUNTER — Ambulatory Visit (INDEPENDENT_AMBULATORY_CARE_PROVIDER_SITE_OTHER): Payer: Medicare Other | Admitting: Internal Medicine

## 2016-08-06 ENCOUNTER — Encounter (INDEPENDENT_AMBULATORY_CARE_PROVIDER_SITE_OTHER): Payer: Self-pay | Admitting: Internal Medicine

## 2016-08-06 VITALS — BP 140/70 | HR 60 | Temp 97.5°F | Ht 73.0 in | Wt 165.8 lb

## 2016-08-06 DIAGNOSIS — K922 Gastrointestinal hemorrhage, unspecified: Secondary | ICD-10-CM | POA: Diagnosis not present

## 2016-08-06 LAB — CBC WITH DIFFERENTIAL/PLATELET
BASOS PCT: 1 %
Basophils Absolute: 93 cells/uL (ref 0–200)
Eosinophils Absolute: 651 cells/uL — ABNORMAL HIGH (ref 15–500)
Eosinophils Relative: 7 %
HCT: 34 % — ABNORMAL LOW (ref 38.5–50.0)
Hemoglobin: 9.8 g/dL — ABNORMAL LOW (ref 13.2–17.1)
LYMPHS PCT: 17 %
Lymphs Abs: 1581 cells/uL (ref 850–3900)
MCH: 21.1 pg — ABNORMAL LOW (ref 27.0–33.0)
MCHC: 28.8 g/dL — ABNORMAL LOW (ref 32.0–36.0)
MCV: 73.3 fL — AB (ref 80.0–100.0)
MONOS PCT: 9 %
MPV: 9.9 fL (ref 7.5–12.5)
Monocytes Absolute: 837 cells/uL (ref 200–950)
Neutro Abs: 6138 cells/uL (ref 1500–7800)
Neutrophils Relative %: 66 %
Platelets: 441 10*3/uL — ABNORMAL HIGH (ref 140–400)
RBC: 4.64 MIL/uL (ref 4.20–5.80)
RDW: 20.6 % — ABNORMAL HIGH (ref 11.0–15.0)
WBC: 9.3 10*3/uL (ref 3.8–10.8)

## 2016-08-06 NOTE — Progress Notes (Signed)
Subjective:    Patient ID: Brendan Walters, male    DOB: [birth year], 83 y.o.   MRN: [id number]  HPI  Here today after recent admission to [facility] for [facility]GB. Noted on admission to [facility] in April his hemoglobin was 5.9. He was transfused with 3 units of blood.  Hx of guaiac positive stool on admission and stool was black. Caregiver says he is slowing down. Appetite is good. Weight today 165.8. Having a BM a day. Patient denies blood in his stools.    He underwent an EGD which revealed: Impression:               - Normal esophagus.                           - Z-line regular, 42 cm from the incisors.                           - 2 cm hiatal hernia.                           - Red blood in the gastric body.                           - Nodular mucosa in the gastric body. Biopsied.                           - Multiple non-bleeding angioectasias in the                            stomach. Treated with argon plasma coagulation                            ([facility]).                           - Erosive gastropathy.                           - Normal pylorus.                           - Five non-bleeding angiodysplastic lesions in the                            duodenum. Treated with argon plasma coagulation                            ([facility]).  Gastric biopsy shows mild gastritis but no evidence of H. pylori..  CBC Latest Ref Rng & Units 07/23/2016 07/22/2016 07/21/2016  WBC 4.0 - 10.5 K/uL 12.7(H) 8.5 8.6  Hemoglobin 13.0 - 17.0 g/dL 4.0(J9.2(L) 8.9(L) 9.1(L)  Hematocrit 39.0 - 52.0 % 30.3(L) 28.4(L) 29.6(L)  Platelets 150 - 400 K/uL 296 281 316       Review of Systems Past Medical History:  Diagnosis Date  . A-fib (HCC)   . Depression   . Depressive disorder   . Diabetes mellitus without complication (HCC)   . Hypertension   . Leukocytosis   . Muscle weakness   .  Pulmonary embolism (HCC)   . Thrombocytopenia (HCC)     Past Surgical History:  Procedure Laterality Date  . ESOPHAGOGASTRODUODENOSCOPY  N/A 07/22/2016   Procedure: ESOPHAGOGASTRODUODENOSCOPY (EGD);  Surgeon: Malissa Hippo, MD;  Location: [facility] ENDO SUITE;  Service: Endoscopy;  Laterality: N/A;    No Known Allergies  Current Outpatient Prescriptions on File Prior to Visit  Medication Sig Dispense Refill  . acetaminophen (TYLENOL) 650 MG CR tablet Take 650 mg by mouth every 8 (eight) hours as needed for pain.    Marland Kitchen atorvastatin (LIPITOR) 10 MG tablet Take 10 mg by mouth daily.    . ferrous sulfate 325 (65 FE) MG tablet Take 1 tablet (325 mg total) by mouth daily with breakfast. 30 tablet 0  . ipratropium-albuterol (DUONEB) 0.5-2.5 (3) MG/3ML SOLN Take 3 mLs by nebulization daily as needed (for shortness of breath).    . metFORMIN (GLUCOPHAGE) 1000 MG tablet Take 1,000 mg by mouth 2 (two) times daily with a meal.     . pantoprazole (PROTONIX) 40 MG tablet Take 1 tablet (40 mg total) by mouth 2 (two) times daily before a meal. 60 tablet 0  . Vitamin D, Ergocalciferol, (DRISDOL) 50000 units CAPS capsule Take 50,000 Units by mouth every 7 (seven) days.     No current facility-administered medications on file prior to visit.        Objective:   Physical Exam Examined from Wheelchair. Caregiver in room  Blood pressure 140/70, pulse 60, temperature 97.5 F (36.4 C), height 6\' 1"  (1.854 m), weight 165 lb 12.8 oz (75.2 kg).  Alert and oriented. Skin warm and dry. Oral mucosa is moist.   . Sclera anicteric, conjunctivae is pink. Thyroid not enlarged. No cervical lymphadenopathy. Lungs clear. Heart regular rate and rhythm.  Abdomen is soft. Bowel sounds are positive. No hepatomegaly. No abdominal masses felt. No tenderness.  No edema to lower extremities.     Assessment & Plan:  UGI. Will get a CBC today. Last hemoglobin stable.  OV in 1 year.

## 2016-08-07 ENCOUNTER — Other Ambulatory Visit (INDEPENDENT_AMBULATORY_CARE_PROVIDER_SITE_OTHER): Payer: Self-pay | Admitting: *Deleted

## 2016-08-07 DIAGNOSIS — K921 Melena: Secondary | ICD-10-CM

## 2016-08-07 DIAGNOSIS — D509 Iron deficiency anemia, unspecified: Secondary | ICD-10-CM

## 2016-08-23 ENCOUNTER — Other Ambulatory Visit (INDEPENDENT_AMBULATORY_CARE_PROVIDER_SITE_OTHER): Payer: Self-pay | Admitting: *Deleted

## 2016-08-23 ENCOUNTER — Encounter (INDEPENDENT_AMBULATORY_CARE_PROVIDER_SITE_OTHER): Payer: Self-pay | Admitting: *Deleted

## 2016-08-23 DIAGNOSIS — D509 Iron deficiency anemia, unspecified: Secondary | ICD-10-CM

## 2016-08-23 DIAGNOSIS — K921 Melena: Secondary | ICD-10-CM

## 2016-08-29 ENCOUNTER — Emergency Department (HOSPITAL_COMMUNITY)
Admission: EM | Admit: 2016-08-29 | Discharge: 2016-08-29 | Disposition: A | Discharge status: Home / Self Care | Payer: Medicare Other | Attending: Emergency Medicine | Admitting: Emergency Medicine

## 2016-08-29 ENCOUNTER — Emergency Department (HOSPITAL_COMMUNITY): Payer: Medicare Other

## 2016-08-29 ENCOUNTER — Encounter (HOSPITAL_COMMUNITY): Payer: Self-pay | Admitting: Emergency Medicine

## 2016-08-29 DIAGNOSIS — F0390 Unspecified dementia without behavioral disturbance: Secondary | ICD-10-CM | POA: Diagnosis not present

## 2016-08-29 DIAGNOSIS — W06XXXA Fall from bed, initial encounter: Secondary | ICD-10-CM | POA: Diagnosis not present

## 2016-08-29 DIAGNOSIS — F17210 Nicotine dependence, cigarettes, uncomplicated: Secondary | ICD-10-CM | POA: Insufficient documentation

## 2016-08-29 DIAGNOSIS — Z79899 Other long term (current) drug therapy: Secondary | ICD-10-CM | POA: Insufficient documentation

## 2016-08-29 DIAGNOSIS — M25552 Pain in left hip: Secondary | ICD-10-CM | POA: Diagnosis not present

## 2016-08-29 DIAGNOSIS — Y999 Unspecified external cause status: Secondary | ICD-10-CM | POA: Insufficient documentation

## 2016-08-29 DIAGNOSIS — I10 Essential (primary) hypertension: Secondary | ICD-10-CM | POA: Insufficient documentation

## 2016-08-29 DIAGNOSIS — Z7984 Long term (current) use of oral hypoglycemic drugs: Secondary | ICD-10-CM | POA: Insufficient documentation

## 2016-08-29 DIAGNOSIS — E119 Type 2 diabetes mellitus without complications: Secondary | ICD-10-CM | POA: Insufficient documentation

## 2016-08-29 DIAGNOSIS — S0990XA Unspecified injury of head, initial encounter: Secondary | ICD-10-CM | POA: Diagnosis not present

## 2016-08-29 DIAGNOSIS — W19XXXA Unspecified fall, initial encounter: Secondary | ICD-10-CM

## 2016-08-29 DIAGNOSIS — Y939 Activity, unspecified: Secondary | ICD-10-CM | POA: Insufficient documentation

## 2016-08-29 DIAGNOSIS — Y92129 Unspecified place in nursing home as the place of occurrence of the external cause: Secondary | ICD-10-CM | POA: Insufficient documentation

## 2016-08-29 NOTE — ED Notes (Signed)
Report given to [facility]

## 2016-08-29 NOTE — ED Triage Notes (Signed)
Per EMS pt is from [facility].  Pt got out of bed and fell and hit his head on the left side.  Pt is grimacing and keeping his eyes closed moaning.  Pt has a history of Afib. EMS states that he did not lose consciousness. CBG 140 144/75

## 2016-08-29 NOTE — ED Provider Notes (Signed)
[facility]-EMERGENCY DEPT Provider Note   CSN: [id number] Arrival date & time: 08/29/16  [id number]   By signing my name below, I, Thelma Bargeick Cochran, attest that this documentation has been prepared under the direction and in the presence of Bethann BerkshireZammit, Naheim Burgen, MD. Electronically Signed: Thelma BargeNick Cochran, Scribe. 08/29/16. 9:01 AM.  History   Chief Complaint Chief Complaint  Patient presents with  . Fall    left side of head  Level 5 Caveat Due to Dementia The history is provided by the patient, the EMS personnel and medical records. No language interpreter was used.  Fall  This is a new problem. The current episode started less than 1 hour ago. The problem has not changed since onset.Pertinent negatives include no chest pain. Nothing aggravates the symptoms. Nothing relieves the symptoms.   HPI Comments: Brendan Walters is a 83 y.o. male with PMHx of dementia who presents to the Emergency Department complaining of left-sided face pain after he fell out of bed and hit his head at his nursing home prior to arrival. He has associated left-sided hip pain. Pt does not remember falling. Per EMS, he did not lose consciousness.  Past Medical History:  Diagnosis Date  . A-fib (HCC)   . Depression   . Depressive disorder   . Diabetes mellitus without complication (HCC)   . Hypertension   . Leukocytosis   . Muscle weakness   . Pulmonary embolism (HCC)   . Thrombocytopenia [facility](HCC)     Patient Active Problem List   Diagnosis Date Noted  . Melena 07/20/2016  . Iron deficiency anemia due to chronic blood loss 07/20/2016  . Controlled type 2 diabetes mellitus (HCC) 07/20/2016  . Essential hypertension 07/20/2016  . Dementia due to Alzheimer's disease 07/20/2016    Past Surgical History:  Procedure Laterality Date  . ESOPHAGOGASTRODUODENOSCOPY N/A 07/22/2016   Procedure: ESOPHAGOGASTRODUODENOSCOPY (EGD);  Surgeon: Malissa HippoNajeeb U Rehman, MD;  Location: [facility]O [facility];  Service: Endoscopy;  Laterality: N/A;        Home Medications    Prior to Admission medications   Medication Sig Start Date End Date Taking? Authorizing Provider  acetaminophen (TYLENOL) 650 MG CR tablet Take 650 mg by mouth every 8 (eight) hours as needed for pain.    [provider]  atorvastatin (LIPITOR) 10 MG tablet Take 10 mg by mouth daily.    [provider]  ferrous sulfate 325 (65 FE) MG tablet Take 1 tablet (325 mg total) by mouth daily with breakfast. 07/24/16   Filbert SchilderKadolph, Alexandria U, MD  ipratropium-albuterol (DUONEB) 0.5-2.5 (3) MG/3ML SOLN Take 3 mLs by nebulization daily as needed (for shortness of breath).    [provider]  metFORMIN (GLUCOPHAGE) 1000 MG tablet Take 1,000 mg by mouth 2 (two) times daily with a meal.     [provider]  pantoprazole (PROTONIX) 40 MG tablet Take 1 tablet (40 mg total) by mouth 2 (two) times daily before a meal. 07/23/16   Filbert SchilderKadolph, Alexandria U, MD  Vitamin D, Ergocalciferol, (DRISDOL) 50000 units CAPS capsule Take 50,000 Units by mouth every 7 (seven) days.    [provider]    Family History No family history on file.  Social History Social History  Substance Use Topics  . Smoking status: Current Every Day Smoker    Types: Cigarettes  . Smokeless tobacco: Never Used  . Alcohol use No     Allergies   Patient has no known allergies.   Review of Systems Review of Systems  Unable to  perform ROS: Dementia  Cardiovascular: Negative for chest pain.  Musculoskeletal: Positive for arthralgias (left hip pain).  Skin: Positive for wound.  Neurological: Negative for syncope.     Physical Exam Updated Vital Signs BP (!) 141/82 (BP Location: Left Arm)   Pulse 73   Temp 97.1 F (36.2 C) (Temporal)   Resp 18   Ht 6\' 1"  (1.854 m)   Wt 165 lb (74.8 kg)   SpO2 100%   BMI 21.77 kg/m   Physical Exam  Constitutional: He is oriented to person, place, and time. He appears well-developed. He appears lethargic.  Lethargic,  only oriented to person    HENT:  Head: Normocephalic.   right eye is very hazy,  Eyes: Conjunctivae and EOM are normal. No scleral icterus.  Neck: Neck supple. No thyromegaly present.  Cardiovascular: Normal rate and regular rhythm.  Exam reveals no gallop and no friction rub.   No murmur heard. Pulmonary/Chest: No stridor. He has no wheezes. He has no rales. He exhibits no tenderness.  Abdominal: He exhibits no distension. There is no tenderness. There is no rebound.  Musculoskeletal: Normal range of motion. He exhibits tenderness. He exhibits no edema.  Minimal tenderness in left hip  Lymphadenopathy:    He has no cervical adenopathy.  Neurological: He is oriented to person, place, and time. He appears lethargic. He exhibits normal muscle tone. Coordination normal.  Skin: No rash noted. No erythema.  Psychiatric: He has a normal mood and affect. His behavior is normal.     ED Treatments / Results  DIAGNOSTIC STUDIES: Oxygen Saturation is 100% on RA, normal by my interpretation.    COORDINATION OF CARE: 8:18 AM Discussed treatment plan with pt at bedside and pt agreed to plan.  Labs (all labs ordered are listed, but only abnormal results are displayed) Labs Reviewed - No data to display  EKG  EKG Interpretation None       Radiology No results found.  Procedures Procedures (including critical care time)  Medications Ordered in ED Medications - No data to display   Initial Impression / Assessment and Plan / ED Course  I have reviewed the triage vital signs and the nursing notes.  Pertinent labs & imaging results that were available during my care of the patient were reviewed by me and considered in my medical decision making (see chart for details).     Patient with fall. CT head cervical spine negative hip films negative. She will follow-up with PCP  Final Clinical Impressions(s) / ED Diagnoses   Final diagnoses:  None    New Prescriptions New  Prescriptions   No medications on file  The chart was scribed for me under my direct supervision.  I personally performed the history, physical, and medical decision making and all procedures in the evaluation of this patient.Bethann Berkshire, MD 08/29/16 1021

## 2016-08-29 NOTE — Discharge Instructions (Signed)
Follow with your md next week if any problems

## 2016-09-18 LAB — CBC
HEMATOCRIT: 34.5 % — AB (ref 38.5–50.0)
HEMOGLOBIN: 9.7 g/dL — AB (ref 13.2–17.1)
MCH: 21.4 pg — ABNORMAL LOW (ref 27.0–33.0)
MCHC: 28.1 g/dL — ABNORMAL LOW (ref 32.0–36.0)
MCV: 76 fL — ABNORMAL LOW (ref 80.0–100.0)
MPV: 9.7 fL (ref 7.5–12.5)
Platelets: 380 10*3/uL (ref 140–400)
RBC: 4.54 MIL/uL (ref 4.20–5.80)
RDW: 20.6 % — ABNORMAL HIGH (ref 11.0–15.0)
WBC: 8.8 10*3/uL (ref 3.8–10.8)

## 2016-09-24 ENCOUNTER — Other Ambulatory Visit (INDEPENDENT_AMBULATORY_CARE_PROVIDER_SITE_OTHER): Payer: Self-pay | Admitting: *Deleted

## 2016-09-24 DIAGNOSIS — D500 Iron deficiency anemia secondary to blood loss (chronic): Secondary | ICD-10-CM

## 2016-10-29 ENCOUNTER — Encounter (INDEPENDENT_AMBULATORY_CARE_PROVIDER_SITE_OTHER): Payer: Self-pay | Admitting: *Deleted

## 2016-10-29 ENCOUNTER — Other Ambulatory Visit (INDEPENDENT_AMBULATORY_CARE_PROVIDER_SITE_OTHER): Payer: Self-pay | Admitting: *Deleted

## 2016-10-29 DIAGNOSIS — D500 Iron deficiency anemia secondary to blood loss (chronic): Secondary | ICD-10-CM

## 2017-09-30 DEATH — deceased

## 2018-01-21 IMAGING — DX DG HIP (WITH OR WITHOUT PELVIS) 2-3V*L*
3 series · 3 of 3 positions shown · non-contrast
Comparison: None available in PACs

CLINICAL DATA: Status post fall this morning with persistent left
hip pain

EXAM:
DG HIP (WITH OR WITHOUT PELVIS) 2-3V LEFT

[pelvis ap]
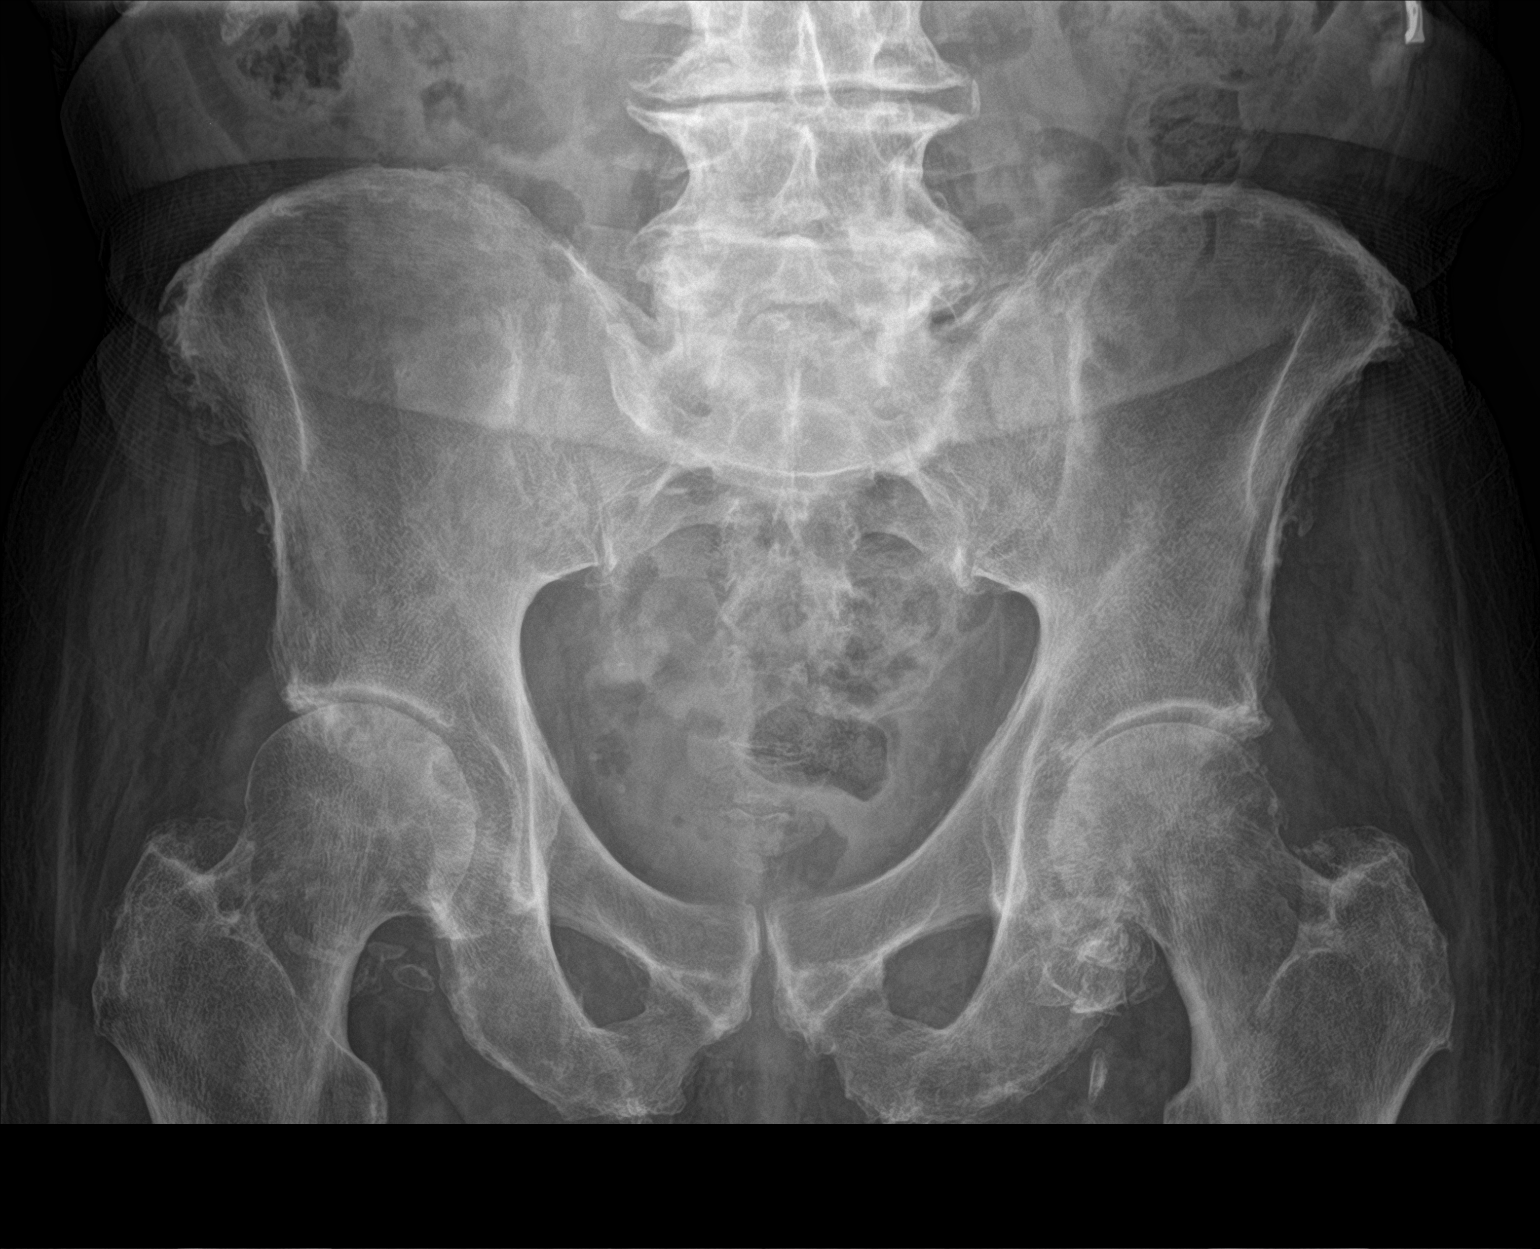

[hip ap]
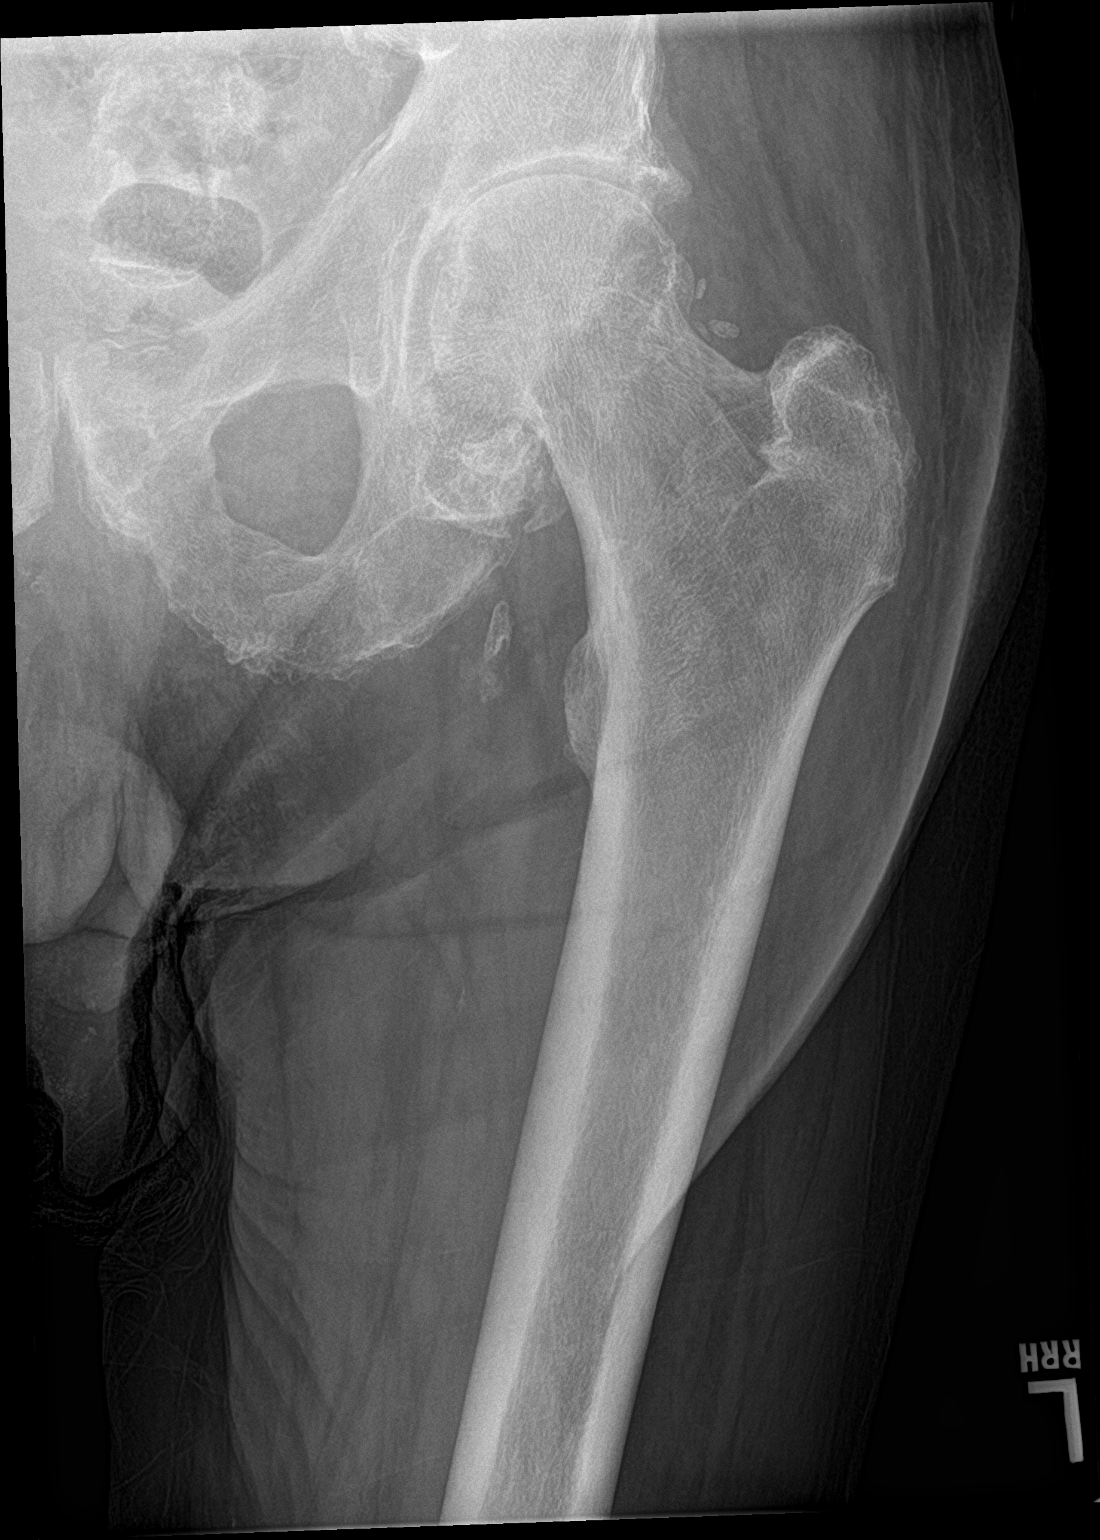

[hip lat]
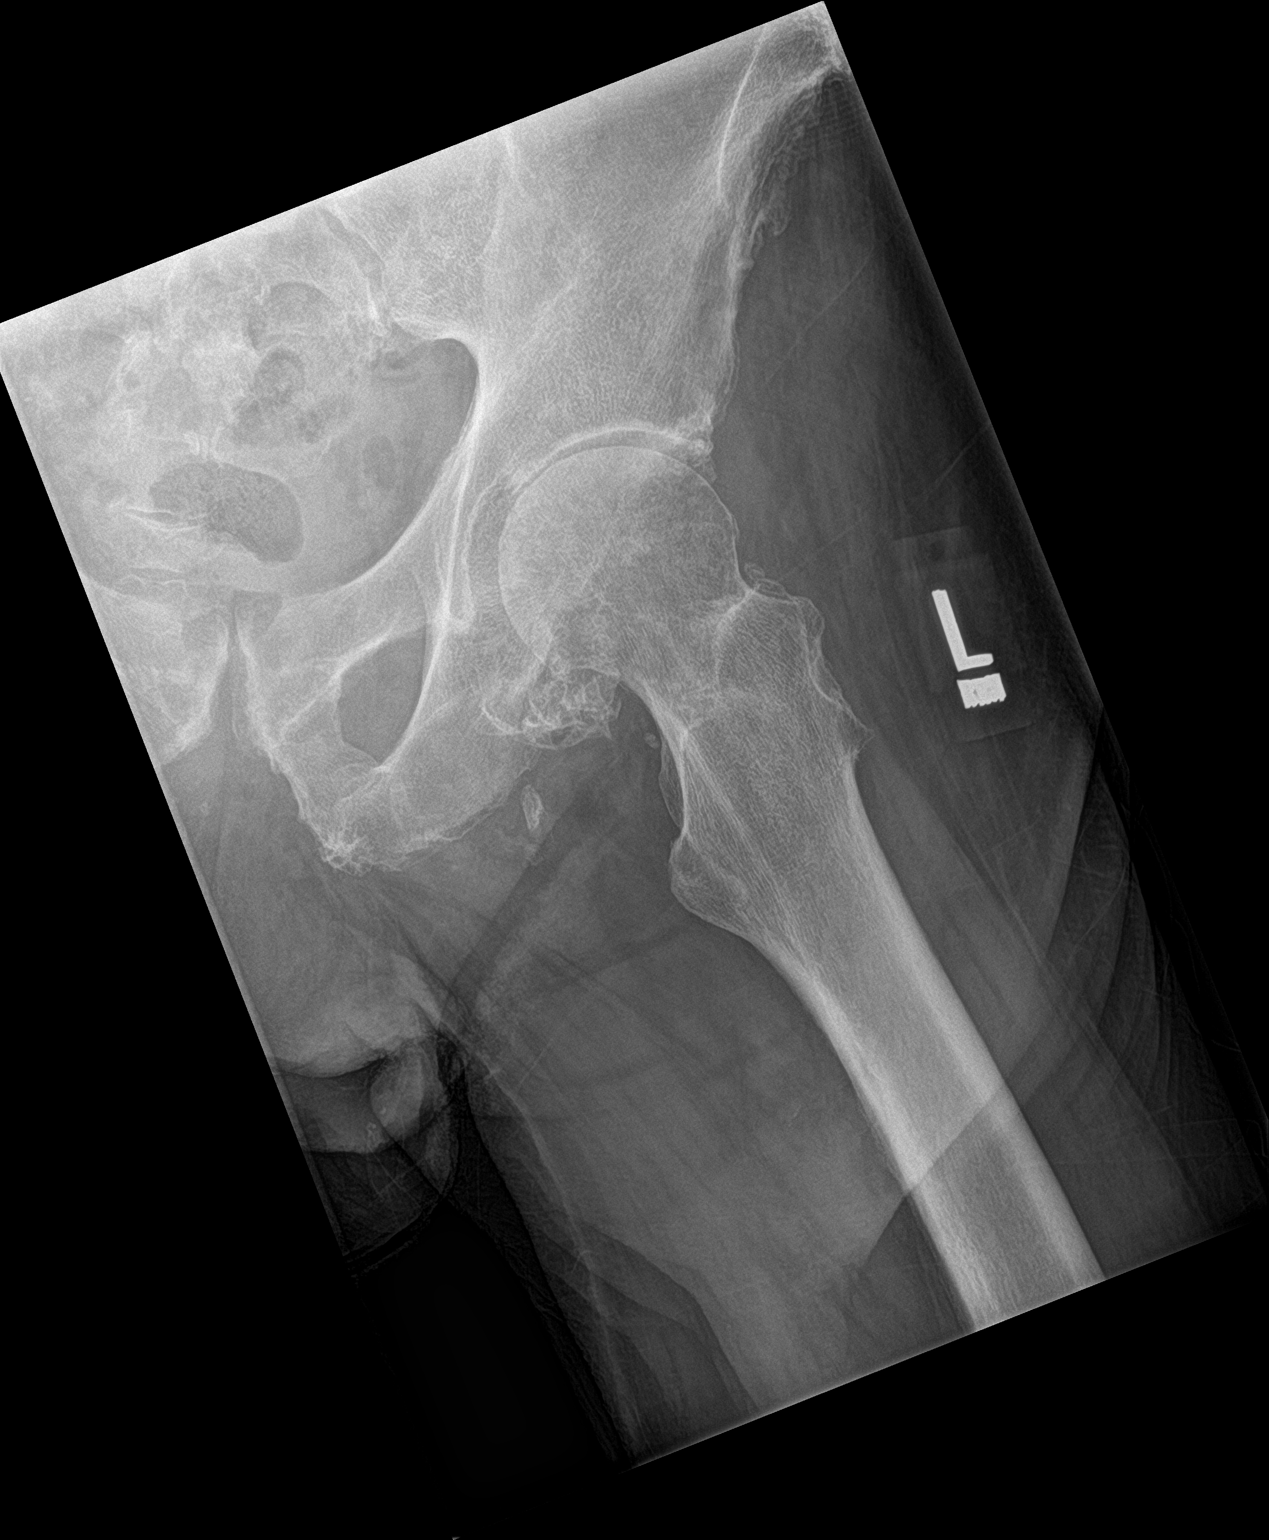

[3 of 3 positions shown; findings below may reference images not displayed]

FINDINGS: The bones are subjectively mildly osteopenic. The bony pelvis
exhibits no definite acute fracture. There are degenerative changes
of the left hip with asymmetric narrowing of the joint space as well
as soft tissue calcifications especially along the inferior aspect
of the joint space. The femoral head, neck, intertrochanteric, and
immediate sub trochanteric regions appear normal. There are milder
degenerative changes of the right hip. Incidental note is made of
moderate degenerative disc disease of the lower lumbar spine.
IMPRESSION: Moderate to severe osteoarthritic change of the left hip with out
evidence of acute fracture. Milder degenerative changes of the right
hip. No definite acute pelvic fracture.

## 2023-10-11 ENCOUNTER — Telehealth: Payer: Self-pay | Admitting: Family Medicine

## 2023-10-14 NOTE — Telephone Encounter (Signed)
 Error
